# Patient Record
Sex: Male | Born: 1970 | Race: White | Hispanic: No | Smoking: Never smoker
Health system: Southern US, Community
[De-identification: ages and names within clinical notes are randomized; demographics above are authoritative.]

## PROBLEM LIST (undated history)

## (undated) DIAGNOSIS — F419 Anxiety disorder, unspecified: Secondary | ICD-10-CM

## (undated) DIAGNOSIS — I10 Essential (primary) hypertension: Secondary | ICD-10-CM

## (undated) DIAGNOSIS — J45909 Unspecified asthma, uncomplicated: Secondary | ICD-10-CM

## (undated) HISTORY — DX: Essential (primary) hypertension: I10

## (undated) HISTORY — DX: Anxiety disorder, unspecified: F41.9

---
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
## 1998-08-15 ENCOUNTER — Encounter: Admission: RE | Admit: 1998-08-15 | Discharge: 1998-11-07 | Payer: Self-pay

## 2008-06-10 ENCOUNTER — Emergency Department (HOSPITAL_COMMUNITY): Admission: EM | Admit: 2008-06-10 | Discharge: 2008-06-10 | Payer: Self-pay | Admitting: Family Medicine

## 2008-08-05 ENCOUNTER — Emergency Department (HOSPITAL_COMMUNITY): Admission: EM | Admit: 2008-08-05 | Discharge: 2008-08-05 | Payer: Self-pay | Admitting: Emergency Medicine

## 2011-07-10 LAB — POCT I-STAT, CHEM 8
Calcium, Ion: 1.22
Creatinine, Ser: 1.1
Potassium: 4.2
Sodium: 138

## 2011-07-10 LAB — POCT URINALYSIS DIP (DEVICE)
Hgb urine dipstick: NEGATIVE
Nitrite: NEGATIVE
Operator id: 247071
Protein, ur: NEGATIVE
Urobilinogen, UA: 0.2

## 2013-04-14 ENCOUNTER — Emergency Department (INDEPENDENT_AMBULATORY_CARE_PROVIDER_SITE_OTHER)
Admission: EM | Admit: 2013-04-14 | Discharge: 2013-04-14 | Disposition: A | Discharge status: Home / Self Care | Payer: Self-pay | Source: Home / Self Care | Attending: Family Medicine | Admitting: Family Medicine

## 2013-04-14 ENCOUNTER — Encounter (HOSPITAL_COMMUNITY): Payer: Self-pay | Admitting: Emergency Medicine

## 2013-04-14 DIAGNOSIS — S058X9A Other injuries of unspecified eye and orbit, initial encounter: Secondary | ICD-10-CM

## 2013-04-14 DIAGNOSIS — S0502XA Injury of conjunctiva and corneal abrasion without foreign body, left eye, initial encounter: Secondary | ICD-10-CM

## 2013-04-14 DIAGNOSIS — Z23 Encounter for immunization: Secondary | ICD-10-CM

## 2013-04-14 MED ORDER — MOXIFLOXACIN HCL 0.5 % OP SOLN: 1.0000 [drp] | Freq: Four times a day (QID) | OPHTHALMIC | Status: DC

## 2013-04-14 MED ORDER — TETANUS-DIPHTHERIA TOXOIDS TD 5-2 LFU IM INJ
0.5000 mL | INJECTION | Freq: Once | INTRAMUSCULAR | Status: AC
  Administered 2013-04-14: 0.5 mL via INTRAMUSCULAR

## 2013-04-14 MED ORDER — TETANUS-DIPHTH-ACELL PERTUSSIS 5-2.5-18.5 LF-MCG/0.5 IM SUSP
INTRAMUSCULAR | Status: AC
  Filled 2013-04-14: qty 0.5

## 2013-04-14 MED ORDER — HYDROCODONE-ACETAMINOPHEN 5-325 MG PO TABS: 1.0000 | ORAL_TABLET | ORAL | Status: DC | PRN

## 2013-04-14 NOTE — ED Notes (Signed)
Patient states he was hit in the left eye by a twig as he was cutting twigs off a bush.    Patient did use Vaseline but no relief.  Patient states he can not keep the eye open and that it burns.  Patient states because of the eye pain his head is hurting and the back of his eye hurts also.

## 2013-04-14 NOTE — ED Notes (Signed)
Shawn Macias at [facility] pharmacy: called to substitute medication for vigamox, pharmacy substituted moxeza.  Dr Artis Flock agreed to this substitution

## 2013-04-14 NOTE — ED Provider Notes (Signed)
   History    CSN: [id number] Arrival date & time 04/14/13  1540  First MD Initiated Contact with Patient 04/14/13 1654     Chief Complaint  Patient presents with  . Eye Pain   (Consider location/radiation/quality/duration/timing/severity/associated sxs/prior Treatment) Patient is a 42 y.o. male presenting with eye injury. The history is provided by the patient.  Eye Injury This is a new problem. The current episode started 1 to 2 hours ago (trimming bushes and struck in left eye with branch.). The problem has not changed since onset.  History reviewed. No pertinent past medical history. No past surgical history on file. No family history on file. History  Substance Use Topics  . Smoking status: Not on file  . Smokeless tobacco: Not on file  . Alcohol Use: Not on file    Review of Systems  Constitutional: Negative.   Eyes: Positive for photophobia, pain, redness and visual disturbance.    Allergies  Review of patient's allergies indicates no known allergies.  Home Medications   Current Outpatient Rx  Name  Route  Sig  Dispense  Refill  . HYDROcodone-acetaminophen (NORCO/VICODIN) 5-325 MG per tablet   Oral   Take 1 tablet by mouth every 4 (four) hours as needed for pain.   10 tablet   0   . moxifloxacin (VIGAMOX) 0.5 % ophthalmic solution   Left Eye   Place 1 drop into the left eye 4 (four) times daily.   3 mL   0    BP 154/91  Pulse 64  Temp(Src) 97.9 F (36.6 C) (Oral)  Resp 16  SpO2 100% Physical Exam  Nursing note and vitals reviewed. Constitutional: He appears well-developed and well-nourished. He appears distressed.  HENT:  Head: Normocephalic.  Eyes: Conjunctivae, EOM and lids are normal. Pupils are equal, round, and reactive to light. No foreign bodies found. Left conjunctiva is not injected.  Slit lamp exam:      The left eye shows corneal abrasion. The left eye shows no foreign body, no hyphema, no fluorescein uptake and no anterior chamber  bulge.      ED Course  Procedures (including critical care time) Labs Reviewed - No data to display No results found. 1. Corneal abrasion, left, initial encounter     MDM  Discussed with dr sanders and will f/u on thurs in office.  Linna Hoff, MD 04/14/13 [phone]

## 2014-05-31 ENCOUNTER — Encounter (HOSPITAL_COMMUNITY): Payer: Self-pay | Admitting: Emergency Medicine

## 2014-05-31 ENCOUNTER — Emergency Department (HOSPITAL_COMMUNITY)
Admission: EM | Admit: 2014-05-31 | Discharge: 2014-05-31 | Disposition: A | Discharge status: Home / Self Care | Payer: Self-pay | Attending: Emergency Medicine | Admitting: Emergency Medicine

## 2014-05-31 ENCOUNTER — Emergency Department (HOSPITAL_COMMUNITY): Payer: Self-pay

## 2014-05-31 DIAGNOSIS — Z79899 Other long term (current) drug therapy: Secondary | ICD-10-CM | POA: Insufficient documentation

## 2014-05-31 DIAGNOSIS — F17200 Nicotine dependence, unspecified, uncomplicated: Secondary | ICD-10-CM | POA: Insufficient documentation

## 2014-05-31 DIAGNOSIS — R0789 Other chest pain: Secondary | ICD-10-CM | POA: Insufficient documentation

## 2014-05-31 DIAGNOSIS — J45909 Unspecified asthma, uncomplicated: Secondary | ICD-10-CM | POA: Insufficient documentation

## 2014-05-31 DIAGNOSIS — R112 Nausea with vomiting, unspecified: Secondary | ICD-10-CM | POA: Insufficient documentation

## 2014-05-31 DIAGNOSIS — R079 Chest pain, unspecified: Secondary | ICD-10-CM | POA: Insufficient documentation

## 2014-05-31 HISTORY — DX: Unspecified asthma, uncomplicated: J45.909

## 2014-05-31 LAB — LIPASE, BLOOD: Lipase: 20 U/L (ref 11–59)

## 2014-05-31 LAB — BASIC METABOLIC PANEL
Anion gap: 17 — ABNORMAL HIGH (ref 5–15)
BUN: 14 mg/dL (ref 6–23)
CALCIUM: 9.4 mg/dL (ref 8.4–10.5)
CHLORIDE: 102 meq/L (ref 96–112)
CO2: 20 meq/L (ref 19–32)
CREATININE: 0.97 mg/dL (ref 0.50–1.35)
GFR calc Af Amer: >90 mL/min (ref >90)
Glucose, Bld: 102 mg/dL — ABNORMAL HIGH (ref 70–99)
POTASSIUM: 4.4 meq/L (ref 3.7–5.3)
SODIUM: 139 meq/L (ref 137–147)

## 2014-05-31 LAB — CBC
HCT: 42.8 % (ref 39.0–52.0)
HEMOGLOBIN: 15.1 g/dL (ref 13.0–17.0)
MCH: 31.6 pg (ref 26.0–34.0)
MCHC: 35.3 g/dL (ref 30.0–36.0)
MCV: 89.5 fL (ref 78.0–100.0)
Platelets: 326 10*3/uL (ref 150–400)
RBC: 4.78 MIL/uL (ref 4.22–5.81)
RDW: 13.1 % (ref 11.5–15.5)
WBC: 9.7 10*3/uL (ref 4.0–10.5)

## 2014-05-31 LAB — HEPATIC FUNCTION PANEL
ALBUMIN: 4.4 g/dL (ref 3.5–5.2)
ALK PHOS: 71 U/L (ref 39–117)
ALT: 16 U/L (ref 0–53)
AST: 10 U/L (ref 0–37)
BILIRUBIN TOTAL: 1.2 mg/dL (ref 0.3–1.2)
TOTAL PROTEIN: 7.9 g/dL (ref 6.0–8.3)

## 2014-05-31 LAB — I-STAT TROPONIN, ED
TROPONIN I, POC: 0 ng/mL (ref 0.00–0.08)
Troponin i, poc: 0 ng/mL (ref 0.00–0.08)

## 2014-05-31 MED ORDER — KETOROLAC TROMETHAMINE 30 MG/ML IJ SOLN
30.0000 mg | Freq: Once | INTRAMUSCULAR | Status: AC
  Administered 2014-05-31: 30 mg via INTRAVENOUS
  Filled 2014-05-31: qty 1

## 2014-05-31 MED ORDER — SODIUM CHLORIDE 0.9 % IV BOLUS (SEPSIS)
1000.0000 mL | Freq: Once | INTRAVENOUS | Status: AC
  Administered 2014-05-31: 1000 mL via INTRAVENOUS

## 2014-05-31 MED ORDER — ONDANSETRON 4 MG PO TBDP
8.0000 mg | ORAL_TABLET | Freq: Once | ORAL | Status: AC
  Administered 2014-05-31: 8 mg via ORAL
  Filled 2014-05-31: qty 2

## 2014-05-31 MED ORDER — NAPROXEN 500 MG PO TABS: 500.0000 mg | ORAL_TABLET | Freq: Two times a day (BID) | ORAL | Status: DC

## 2014-05-31 MED ORDER — DIAZEPAM 5 MG PO TABS: 5.0000 mg | ORAL_TABLET | Freq: Two times a day (BID) | ORAL | Status: DC

## 2014-05-31 MED ORDER — ASPIRIN 81 MG PO CHEW
324.0000 mg | CHEWABLE_TABLET | Freq: Once | ORAL | Status: AC
  Administered 2014-05-31: 324 mg via ORAL
  Filled 2014-05-31: qty 4

## 2014-05-31 MED ORDER — DIAZEPAM 5 MG/ML IJ SOLN
5.0000 mg | Freq: Once | INTRAMUSCULAR | Status: AC
  Administered 2014-05-31: 5 mg via INTRAVENOUS
  Filled 2014-05-31: qty 2

## 2014-05-31 NOTE — Discharge Instructions (Signed)
Please follow up with your primary care physician in 1-2 days. If you do not have one please call the [facility] number listed above. Please follow up with Dr. Daleen SquibbWall to schedule a follow up appointment.  Please read all discharge instructions and return precautions.    Chest Pain (Nonspecific) It is often hard to give a specific diagnosis for the cause of chest pain. There is always a chance that your pain could be related to something serious, such as a heart attack or a blood clot in the lungs. You need to follow up with your health care provider for further evaluation. CAUSES   Heartburn.  Pneumonia or bronchitis.  Anxiety or stress.  Inflammation around your heart (pericarditis) or lung (pleuritis or pleurisy).  A blood clot in the lung.  A collapsed lung (pneumothorax). It can develop suddenly on its own (spontaneous pneumothorax) or from trauma to the chest.  Shingles infection (herpes zoster virus). The chest wall is composed of bones, muscles, and cartilage. Any of these can be the source of the pain.  The bones can be bruised by injury.  The muscles or cartilage can be strained by coughing or overwork.  The cartilage can be affected by inflammation and become sore (costochondritis). DIAGNOSIS  Lab tests or other studies may be needed to find the cause of your pain. Your health care provider may have you take a test called an ambulatory electrocardiogram (ECG). An ECG records your heartbeat patterns over a 24-hour period. You may also have other tests, such as:  Transthoracic echocardiogram (TTE). During echocardiography, sound waves are used to evaluate how blood flows through your heart.  Transesophageal echocardiogram (TEE).  Cardiac monitoring. This allows your health care provider to monitor your heart rate and rhythm in real time.  Holter monitor. This is a portable device that records your heartbeat and can help diagnose heart arrhythmias. It  allows your health care provider to track your heart activity for several days, if needed.  Stress tests by exercise or by giving medicine that makes the heart beat faster. TREATMENT   Treatment depends on what may be causing your chest pain. Treatment may include:  Acid blockers for heartburn.  Anti-inflammatory medicine.  Pain medicine for inflammatory conditions.  Antibiotics if an infection is present.  You may be advised to change lifestyle habits. This includes stopping smoking and avoiding alcohol, caffeine, and chocolate.  You may be advised to keep your head raised (elevated) when sleeping. This reduces the chance of acid going backward from your stomach into your esophagus. Most of the time, nonspecific chest pain will improve within 2-3 days with rest and mild pain medicine.  HOME CARE INSTRUCTIONS   If antibiotics were prescribed, take them as directed. Finish them even if you start to feel better.  For the next few days, avoid physical activities that bring on chest pain. Continue physical activities as directed.  Do not use any tobacco products, including cigarettes, chewing tobacco, or electronic cigarettes.  Avoid drinking alcohol.  Only take medicine as directed by your health care provider.  Follow your health care provider's suggestions for further testing if your chest pain does not go away.  Keep any follow-up appointments you made. If you do not go to an appointment, you could develop lasting (chronic) problems with pain. If there is any problem keeping an appointment, call to reschedule. SEEK MEDICAL CARE IF:   Your chest pain does not go away, even after treatment.  You  have a rash with blisters on your chest.  You have a fever. SEEK IMMEDIATE MEDICAL CARE IF:   You have increased chest pain or pain that spreads to your arm, neck, jaw, back, or abdomen.  You have shortness of breath.  You have an increasing cough, or you cough up blood.  You  have severe back or abdominal pain.  You feel nauseous or vomit.  You have severe weakness.  You faint.  You have chills. This is an emergency. Do not wait to see if the pain will go away. Get medical help at once. Call your local emergency services (911 in [place] Do not drive yourself to the hospital. MAKE SURE YOU:   Understand these instructions.  Will watch your condition.  Will get help right away if you are not doing well or get worse. Document Released: 07/03/2005 Document Revised: 09/28/2013 Document Reviewed: 04/28/2008 [facility] Patient Information 2015 [place], [place]. This information is not intended to replace advice given to you by your health care provider. Make sure you discuss any questions you have with your health care provider. Generalized Anxiety Disorder Generalized anxiety disorder (GAD) is a mental disorder. It interferes with life functions, including relationships, work, and school. GAD is different from normal anxiety, which everyone experiences at some point in their lives in response to specific life events and activities. Normal anxiety actually helps [place] prepare for and get through these life events and activities. Normal anxiety goes away after the event or activity is over.  GAD causes anxiety that is not necessarily related to specific events or activities. It also causes excess anxiety in proportion to specific events or activities. The anxiety associated with GAD is also difficult to control. GAD can vary from mild to severe. People with severe GAD can have intense waves of anxiety with physical symptoms (panic attacks).  SYMPTOMS The anxiety and worry associated with GAD are difficult to control. This anxiety and worry are related to many life events and activities and also occur more days than not for 6 months or longer. People with GAD also have three or more of the following symptoms (one or more in children):  Restlessness.   Fatigue.  Difficulty  concentrating.   Irritability.  Muscle tension.  Difficulty sleeping or unsatisfying sleep. DIAGNOSIS GAD is diagnosed through an assessment by your health care provider. Your health care provider will ask you questions aboutyour mood,physical symptoms, and events in your life. Your health care provider may ask you about your medical history and use of alcohol or drugs, including prescription medicines. Your health care provider may also do a physical exam and blood tests. Certain medical conditions and the use of certain substances can cause symptoms similar to those associated with GAD. Your health care provider may refer you to a mental health specialist for further evaluation. TREATMENT The following therapies are usually used to treat GAD:   Medication. Antidepressant medication usually is prescribed for long-term daily control. Antianxiety medicines may be added in severe cases, especially when panic attacks occur.   Talk therapy (psychotherapy). Certain types of talk therapy can be helpful in treating GAD by providing support, education, and guidance. A form of talk therapy called cognitive behavioral therapy can teach you healthy ways to think about and react to daily life events and activities.  Stress managementtechniques. These include yoga, meditation, and exercise and can be very helpful when they are practiced regularly. A mental health specialist can help determine which treatment is best for you. Some  people see improvement with one therapy. However, other people require a combination of therapies. Document Released: 01/18/2013 Document Revised: 02/07/2014 Document Reviewed: 01/18/2013 [facility] Patient Information 2015 [place], [place]. This information is not intended to replace advice given to you by your health care provider. Make sure you discuss any questions you have with your health care provider.  Establish relationship with primary care doctor as discussed. A  resource guide and information on the Affordable Care Act has been provided for your information.    RESOURCE GUIDE  If you do not have a primary care doctor to follow up with regarding today's visit, please call the [facility] at [phone] to make an appointment. Hours of operation are 10am - 7pm, Monday through Friday, and they have a sliding scale fee.   Insufficient Money for Medicine: Contact United Way:  call "211" or [facility] [phone].  No Primary Care Doctor: - Call [facility]  [phone] - can help you locate a primary care doctor that  accepts your insurance, provides certain services, etc. - Physician Referral Service- [phone]  Agencies that provide inexpensive medical care: - [facility]  [phone] - [facility]  [phone] - [facility]  [phone] - [facility]  [phone] - [facility]  [phone] [facility]  [phone]  Medicaid-accepting [facility] Providers: - [facility]- [address]  [phone], Mon-Fri 9am-7pm, Sat 9am-1pm - [facility]- [address] [place], [address] [place]  [phone] - [facility]- [address] [place]  [phone] [facility]- [address]  [phone] - Renaye Rakers- [address] [place], [address], [phone]  Only accepts [place] patients after they have their name  applied to their card  Self Pay (no insurance) in [place]: - Sickle Cell Patients: Dr Willey Blade, [facility]  [address] [place], [phone] - [facility]- [address] [place]  [phone]       [facility]- [address]       -     [facility]- see information above (Speak to [employer] if you do not have insurance)       -  [facility]- [address] [place], [phone]        -  [facility]- [address],  [phone]       -  [facility]- [address] [phone]       -  Dr Julio Sicks-  [address], [place], [phone]       -  [facility]- [address], [phone]       -  [facility]- [address], [phone], also [address] [phone]       -    [facility]- [address] [place], [phone], 1st & 3rd Saturday   every month, 10am-1pm  1) Find a Doctor and Pay Out of Pocket Although you won't have to find out who is covered by your insurance plan, it is a good idea to ask around and get recommendations. You will then need to call the office and see if the doctor you have chosen will accept you as a new patient and what types  of options they offer for patients who are self-pay. Some doctors offer discounts or will set up payment plans for their patients who do not have insurance, but you will need to ask so you aren't surprised when you get to your appointment.  2) Contact Your Local Health Department Not all health departments have doctors that can see patients for sick visits, but many do, so it is worth a call to see if yours does. If you don't know where your local health department is, you can check in your phone book. The CDC also has a tool to help you locate your state's health department, and many state websites also have listings of all of their local health departments.  3) Find a Walk-in Clinic If your illness is not likely to be very severe or complicated, you may want to try a walk in clinic. These are popping up all over the country in pharmacies, drugstores, and shopping centers. They're usually staffed by nurse practitioners or physician assistants that have been trained to treat common illnesses and complaints. They're usually fairly quick and inexpensive. However, if you have serious medical issues or chronic medical problems, these are probably not your best  option

## 2014-05-31 NOTE — ED Provider Notes (Signed)
CSN: [id number]     Arrival date & time 05/31/14  4098 History   First MD Initiated Contact with Patient 05/31/14 0919     Chief Complaint  Patient presents with  . Chest Pain     (Consider location/radiation/quality/duration/timing/severity/associated sxs/prior Treatment) HPI Comments: Patient is a 43 year old male past medical history significant for tobacco abuse, asthma presented to the emergency department for intermittent episodes of central chest pain with radiation to left arm and neck. Patient is unaware of how long each episode of pain lasts. Last episode of pain began a few minutes ago. Alleviating factors: rest. Aggravating factors: none. Medications tried prior to arrival: none. Endorses associated nausea and vomiting on Saturday, this has resolved. Patient does have a brother had an MI at 62 yo. PERC negative.     Patient is a 43 y.o. male presenting with chest pain.  Chest Pain Associated symptoms: nausea and vomiting   Associated symptoms: no cough, no fever and no shortness of breath     Past Medical History  Diagnosis Date  . Asthma    History reviewed. No pertinent past surgical history. No family history on file. History  Substance Use Topics  . Smoking status: Current Every Day Smoker    Types: Cigarettes  . Smokeless tobacco: Not on file  . Alcohol Use: Yes    Review of Systems  Constitutional: Negative for fever and chills.  Respiratory: Negative for cough and shortness of breath.   Cardiovascular: Positive for chest pain. Negative for leg swelling.  Gastrointestinal: Positive for nausea and vomiting.  All other systems reviewed and are negative.     Allergies  Review of patient's allergies indicates no known allergies.  Home Medications   Prior to Admission medications   Medication Sig Start Date End Date Taking? Authorizing Provider  diazepam (VALIUM) 5 MG tablet Take 1 tablet (5 mg total) by mouth 2 (two) times daily. 05/31/14   Dillan Lunden L  Dhruva Orndoff, PA-C  naproxen (NAPROSYN) 500 MG tablet Take 1 tablet (500 mg total) by mouth 2 (two) times daily with a meal. 05/31/14   Christi Wirick L Zyair Russi, PA-C   BP 147/81  Pulse 84  Temp(Src) 98 F (36.7 C) (Oral)  Resp 25  Ht  (1.803 m)  Wt 165 lb (74.844 kg)  BMI 23.02 kg/m2  SpO2 95% Physical Exam  Nursing note and vitals reviewed. Constitutional: He is oriented to person, place, and time. He appears well-developed and well-nourished. No distress.  HENT:  Head: Normocephalic and atraumatic.  Right Ear: External ear normal.  Left Ear: External ear normal.  Nose: Nose normal.  Mouth/Throat: Oropharynx is clear and moist. No oropharyngeal exudate.  Eyes: Conjunctivae are normal.  Neck: Neck supple.  Cardiovascular: Normal rate, regular rhythm, normal heart sounds and intact distal pulses.   Pulmonary/Chest: Effort normal and breath sounds normal. No respiratory distress.  Abdominal: Soft. Bowel sounds are normal. There is no tenderness.  Musculoskeletal: He exhibits no edema.  Neurological: He is alert and oriented to person, place, and time.  Skin: Skin is warm and dry. He is not diaphoretic.    ED Course  Procedures (including critical care time) Medications  ondansetron (ZOFRAN-ODT) disintegrating tablet 8 mg (8 mg Oral Given 05/31/14 1006)  aspirin chewable tablet 324 mg (324 mg Oral Given 05/31/14 1006)  ketorolac (TORADOL) 30 MG/ML injection 30 mg (30 mg Intravenous Given 05/31/14 1144)  diazepam (VALIUM) injection 5 mg (5 mg Intravenous Given 05/31/14 1238)  sodium chloride 0.9 % bolus  1,000 mL (0 mLs Intravenous Stopped 05/31/14 1343)    Labs Review Labs Reviewed  BASIC METABOLIC PANEL - Abnormal; Notable for the following:    Glucose, Bld 102 (*)    Anion gap 17 (*)    All other components within normal limits  CBC  HEPATIC FUNCTION PANEL  LIPASE, BLOOD  I-STAT TROPOININ, ED  I-STAT TROPOININ, ED    Imaging Review Dg Chest 2 View  05/31/2014    CLINICAL DATA:  Left arm pain  EXAM: CHEST  2 VIEW  COMPARISON:  08/05/2008  FINDINGS: The heart size and mediastinal contours are within normal limits. Both lungs are clear. The visualized skeletal structures are unremarkable.  IMPRESSION: No active cardiopulmonary disease.   Electronically Signed   By: Elige Ko   On: 05/31/2014 10:40     EKG Interpretation   Date/Time:  Tuesday May 31 2014 09:06:25 EDT Ventricular Rate:  97 PR Interval:  128 QRS Duration: 92 QT Interval:  338 QTC Calculation: 429 R Axis:   74 Text Interpretation:  Normal sinus rhythm Normal ECG Confirmed by DELOS   MD, DOUGLAS ([id number]) on 05/31/2014 10:11:22 AM      MDM   Final diagnoses:  Chest pain, atypical    Filed Vitals:   05/31/14 1344  BP:   Pulse:   Temp: 98 F (36.7 C)  Resp:    Afebrile, NAD, non-toxic appearing, AAOx4.  Patient is to be discharged with recommendation to follow up with PCP in regards to today's hospital visit. Chest pain is not likely of cardiac or pulmonary etiology d/t presentation, perc negative, VSS, no tracheal deviation, no JVD or new murmur, RRR, breath sounds equal bilaterally, EKG without acute abnormalities, negative delta troponin, and negative CXR. Pt has been advised to return to the ED is CP becomes exertional, associated with diaphoresis or nausea, radiates to left jaw/arm, worsens or becomes concerning in any way. Pt appears reliable for follow up and is agreeable to discharge.   Case has been discussed with Dr. Judd Lien who agrees with the above plan to discharge.      Lise Auer Sora Vrooman, PA-C 05/31/14 1440

## 2014-05-31 NOTE — ED Notes (Signed)
Patient states has been having intermittent chest pain since Saturday of last week.  Patient states had SOB, L arm pain, and neck pain associated with same.   Patient denies pain at this time, but states comes and goes.

## 2014-05-31 NOTE — ED Provider Notes (Signed)
Medical screening examination/treatment/procedure(s) were performed by non-physician practitioner and as supervising physician I was immediately available for consultation/collaboration.   EKG Interpretation   Date/Time:  Tuesday May 31 2014 09:06:25 EDT Ventricular Rate:  97 PR Interval:  128 QRS Duration: 92 QT Interval:  338 QTC Calculation: 429 R Axis:   74 Text Interpretation:  Normal sinus rhythm Normal ECG Confirmed by Malva Cogan   MD, Cashius Grandstaff ([id number]) on 05/31/2014 10:11:22 AM       Geoffery Lyons, MD 05/31/14 1630

## 2014-05-31 NOTE — ED Notes (Signed)
Kim in lab aware of add on labs

## 2014-06-09 ENCOUNTER — Encounter: Payer: Self-pay | Admitting: Internal Medicine

## 2014-06-09 ENCOUNTER — Ambulatory Visit: Payer: Self-pay | Attending: Internal Medicine | Admitting: Internal Medicine

## 2014-06-09 VITALS — BP 156/101 | HR 72 | Temp 98.7°F | Resp 16 | Ht 70.0 in | Wt 162.0 lb

## 2014-06-09 DIAGNOSIS — Z833 Family history of diabetes mellitus: Secondary | ICD-10-CM | POA: Insufficient documentation

## 2014-06-09 DIAGNOSIS — I10 Essential (primary) hypertension: Secondary | ICD-10-CM | POA: Insufficient documentation

## 2014-06-09 DIAGNOSIS — R079 Chest pain, unspecified: Secondary | ICD-10-CM | POA: Insufficient documentation

## 2014-06-09 DIAGNOSIS — Z8249 Family history of ischemic heart disease and other diseases of the circulatory system: Secondary | ICD-10-CM | POA: Insufficient documentation

## 2014-06-09 DIAGNOSIS — Z7189 Other specified counseling: Secondary | ICD-10-CM | POA: Insufficient documentation

## 2014-06-09 DIAGNOSIS — Z823 Family history of stroke: Secondary | ICD-10-CM | POA: Insufficient documentation

## 2014-06-09 LAB — LIPID PANEL
Cholesterol: 144 mg/dL (ref 0–200)
HDL: 55 mg/dL (ref >39)
LDL Cholesterol: 73 mg/dL (ref 0–99)
TRIGLYCERIDES: 80 mg/dL (ref <150)
Total CHOL/HDL Ratio: 2.6 Ratio
VLDL: 16 mg/dL (ref 0–40)

## 2014-06-09 LAB — POCT GLYCOSYLATED HEMOGLOBIN (HGB A1C): Hemoglobin A1C: 5.7

## 2014-06-09 LAB — GLUCOSE, POCT (MANUAL RESULT ENTRY): POC Glucose: 111 mg/dl — AB (ref 70–99)

## 2014-06-09 LAB — TSH: TSH: 1.676 u[IU]/mL (ref 0.350–4.500)

## 2014-06-09 MED ORDER — LISINOPRIL 10 MG PO TABS: 10.0000 mg | ORAL_TABLET | Freq: Every day | ORAL | Status: DC

## 2014-06-09 MED ORDER — BACITRACIN 500 UNIT/GM EX OINT: 1.0000 "application " | TOPICAL_OINTMENT | Freq: Two times a day (BID) | CUTANEOUS | Status: DC

## 2014-06-09 NOTE — Patient Instructions (Signed)
DASH Eating Plan °DASH stands for "Dietary Approaches to Stop Hypertension." The DASH eating plan is a healthy eating plan that has been shown to reduce high blood pressure (hypertension). Additional health benefits may include reducing the risk of type 2 diabetes mellitus, heart disease, and stroke. The DASH eating plan may also help with weight loss. °WHAT DO I NEED TO KNOW ABOUT THE DASH EATING PLAN? °For the DASH eating plan, you will follow these general guidelines: °· Choose foods with a percent daily value for sodium of less than 5% (as listed on the food label). °· Use salt-free seasonings or herbs instead of table salt or sea salt. °· Check with your health care provider or pharmacist before using salt substitutes. °· Eat lower-sodium products, often labeled as "lower sodium" or "no salt added." °· Eat fresh foods. °· Eat more vegetables, fruits, and low-fat dairy products. °· Choose whole grains. Look for the word "whole" as the first word in the ingredient list. °· Choose fish and skinless chicken or turkey more often than red meat. Limit fish, poultry, and meat to 6 oz (170 g) each day. °· Limit sweets, desserts, sugars, and sugary drinks. °· Choose heart-healthy fats. °· Limit cheese to 1 oz (28 g) per day. °· Eat more home-cooked food and less restaurant, buffet, and fast food. °· Limit fried foods. °· Cook foods using methods other than frying. °· Limit canned vegetables. If you do use them, rinse them well to decrease the sodium. °· When eating at a restaurant, ask that your food be prepared with less salt, or no salt if possible. °WHAT FOODS CAN I EAT? °Seek help from a dietitian for individual calorie needs. °Grains °Whole grain or whole wheat bread. Brown rice. Whole grain or whole wheat pasta. Quinoa, bulgur, and whole grain cereals. Low-sodium cereals. Corn or whole wheat flour tortillas. Whole grain cornbread. Whole grain crackers. Low-sodium crackers. °Vegetables °Fresh or frozen vegetables  (raw, steamed, roasted, or grilled). Low-sodium or reduced-sodium tomato and vegetable juices. Low-sodium or reduced-sodium tomato sauce and paste. Low-sodium or reduced-sodium canned vegetables.  °Fruits °All fresh, canned (in natural juice), or frozen fruits. °Meat and Other Protein Products °Ground beef (85% or leaner), grass-fed beef, or beef trimmed of fat. Skinless chicken or turkey. Ground chicken or turkey. Pork trimmed of fat. All fish and seafood. Eggs. Dried beans, peas, or lentils. Unsalted nuts and seeds. Unsalted canned beans. °Dairy °Low-fat dairy products, such as skim or 1% milk, 2% or reduced-fat cheeses, low-fat ricotta or cottage cheese, or plain low-fat yogurt. Low-sodium or reduced-sodium cheeses. °Fats and Oils °Tub margarines without trans fats. Light or reduced-fat mayonnaise and salad dressings (reduced sodium). Avocado. Safflower, olive, or canola oils. Natural peanut or almond butter. °Other °Unsalted popcorn and pretzels. °The items listed above may not be a complete list of recommended foods or beverages. Contact your dietitian for more options. °WHAT FOODS ARE NOT RECOMMENDED? °Grains °White bread. White pasta. White rice. Refined cornbread. Bagels and croissants. Crackers that contain trans fat. °Vegetables °Creamed or fried vegetables. Vegetables in a cheese sauce. Regular canned vegetables. Regular canned tomato sauce and paste. Regular tomato and vegetable juices. °Fruits °Dried fruits. Canned fruit in light or heavy syrup. Fruit juice. °Meat and Other Protein Products °Fatty cuts of meat. Ribs, chicken wings, bacon, sausage, bologna, salami, chitterlings, fatback, hot dogs, bratwurst, and packaged luncheon meats. Salted nuts and seeds. Canned beans with salt. °Dairy °Whole or 2% milk, cream, half-and-half, and cream cheese. Whole-fat or sweetened yogurt. Full-fat   cheeses or blue cheese. Nondairy creamers and whipped toppings. Processed cheese, cheese spreads, or cheese  curds. °Condiments °Onion and garlic salt, seasoned salt, table salt, and sea salt. Canned and packaged gravies. Worcestershire sauce. Tartar sauce. Barbecue sauce. Teriyaki sauce. Soy sauce, including reduced sodium. Steak sauce. Fish sauce. Oyster sauce. Cocktail sauce. Horseradish. Ketchup and mustard. Meat flavorings and tenderizers. Bouillon cubes. Hot sauce. Tabasco sauce. Marinades. Taco seasonings. Relishes. °Fats and Oils °Butter, stick margarine, lard, shortening, ghee, and bacon fat. Coconut, palm kernel, or palm oils. Regular salad dressings. °Other °Pickles and olives. Salted popcorn and pretzels. °The items listed above may not be a complete list of foods and beverages to avoid. Contact your dietitian for more information. °WHERE CAN I FIND MORE INFORMATION? °National Heart, [employer], and [employer] Institute: www.nhlbi.nih.gov/health/health-topics/topics/dash/ °Document Released: 09/12/2011 Document Revised: 02/07/2014 Document Reviewed: 07/28/2013 °ExitCare® Patient Information ©2015 [employer]. This information is not intended to replace advice given to you by your health care provider. Make sure you discuss any questions you have with your health care provider. ° °

## 2014-06-09 NOTE — Progress Notes (Signed)
Patient ID: Shawn Macias, male   DOB: 27-Oct-1970, 43 y.o.   MRN: [id number]  HYQ:[id number]  [id number]:[id number]  DOB - 20-Aug-1971   HPI: Shawn Macias is a 43 y.o. male here today to establish medical care.  Patient reports that he was seen in the ED last week for chest pain. The pain was rule out as cardiac in nature.  He has had hypertension in past but has never been treated for it.  He has noticed slight chest pain since discharge but it only lasted a few minutes.  He has been taking Naproxen once per day since discharge.  He reports abdominal pain since hospital discharge.  He reports that it starts on his left side and causes generalized abdominal pain.  He endorses a change in stools that have been green in color and pasty.  Abdominal pain is a burning and achy pain.     No Known Allergies Past Medical History  Diagnosis Date  . Asthma    Current Outpatient Prescriptions on File Prior to Visit  Medication Sig Dispense Refill  . naproxen (NAPROSYN) 500 MG tablet Take 1 tablet (500 mg total) by mouth 2 (two) times daily with a meal.  30 tablet  0  . diazepam (VALIUM) 5 MG tablet Take 1 tablet (5 mg total) by mouth 2 (two) times daily.  10 tablet  0   No current facility-administered medications on file prior to visit.   Family History  Problem Relation Age of Onset  . Diabetes Mother   . Heart disease Mother   . Hypertension Mother   . Heart disease Brother   . Hypertension Brother   . Diabetes Maternal Grandmother   . Heart disease Maternal Grandmother   . Hypertension Maternal Grandmother   . Stroke Paternal Grandmother   . Stroke Paternal Grandfather    History   Social History  . Marital Status: Single    Spouse Name: N/A    Number of Children: N/A  . Years of Education: N/A   Occupational History  . Not on file.   Social History Main Topics  . Smoking status: Never Smoker   . Smokeless tobacco: Not on file  . Alcohol Use: 1.0 oz/week    2 drink(s) per week  .  Drug Use: No  . Sexual Activity: Yes   Other Topics Concern  . Not on file   Social History Narrative  . No narrative on file    Review of Systems  Eyes: Negative.   Respiratory: Positive for shortness of breath.   Cardiovascular: Positive for chest pain.  Gastrointestinal: Positive for abdominal pain. Negative for nausea, vomiting, diarrhea, constipation and blood in stool.  Genitourinary: Negative.   Musculoskeletal: Negative.   Neurological: Negative for dizziness, tingling and headaches.     Objective:   Filed Vitals:   06/09/14 1201  BP: 156/101  Pulse: 72  Temp: 98.7 F (37.1 C)  Resp: 16    Physical Exam: Constitutional: Patient appears well-developed and well-nourished. No distress. HENT: Normocephalic, atraumatic, External right and left ear normal. Oropharynx is clear and moist.  Eyes: Conjunctivae and EOM are normal. PERRLA, no scleral icterus. Neck: Normal ROM. Neck supple. No JVD. No tracheal deviation. No thyromegaly. CVS: RRR, S1/S2 +, no murmurs, no gallops, no carotid bruit.  Pulmonary: Effort and breath sounds normal, no stridor, rhonchi, wheezes, rales.  Abdominal: Soft. BS +, no distension, tenderness, rebound or guarding.  Musculoskeletal: Normal range of motion. No edema and no tenderness.  Lymphadenopathy:  No lymphadenopathy noted, cervical Neuro: Alert. Normal reflexes, muscle tone coordination. No cranial nerve deficit. Skin: Skin is warm and dry. Not diaphoretic. No pallor. Right arm has erythematous small area, blister-possible bug bite Psychiatric: Normal mood and affect. Behavior, judgment, thought content normal.  Lab Results  Component Value Date   WBC 9.7 05/31/2014   HGB 15.1 05/31/2014   HCT 42.8 05/31/2014   MCV 89.5 05/31/2014   PLT 326 05/31/2014   Lab Results  Component Value Date   CREATININE 0.97 05/31/2014   BUN 14 05/31/2014   NA 139 05/31/2014   K 4.4 05/31/2014   CL 102 05/31/2014   CO2 20 05/31/2014    No results found  for this basename: HGBA1C   Lipid Panel  No results found for this basename: chol, trig, hdl, cholhdl, vldl, ldlcalc       Assessment and plan:   Nicklas was seen today for no specified reason.  Diagnoses and associated orders for this visit:  Essential hypertension - lisinopril (PRINIVIL,ZESTRIL) 10 MG tablet; Take 1 tablet (10 mg total) by mouth daily. - Lipid panel - TSH - PSA  Chest pain All test were negative in ED. Encouraged patient to begin asiprin 81 mg daily.  Family history of diabetes mellitus (DM) - POCT glucose (manual entry) - POCT glycosylated hemoglobin (Hb A1C) - Lipid panel - TSH  Other Orders - bacitracin 500 UNIT/GM ointment; Apply 1 application topically 2 (two) times daily.     Return for  Nurse Visit-BP and 3 mo pcp.     The patient was given clear instructions to go to ER or return to medical center if symptoms don't improve, worsen or new problems develop. Explained in detail specific signs and symptoms that will cause him to seek immediate care.  Holland Commons, NP-C [facility] [phone] 06/09/2014, 12:31 PM

## 2014-06-09 NOTE — Progress Notes (Signed)
Pt. Here for ED follow-up due to chest pain on left side along with neck, back. Since ED visit stomach pain has occurred along with discolored bowel movements (green).

## 2014-06-10 LAB — PSA: PSA: 2.12 ng/mL (ref <=4.00)

## 2014-06-14 ENCOUNTER — Telehealth: Payer: Self-pay | Admitting: Emergency Medicine

## 2014-06-14 NOTE — Telephone Encounter (Signed)
Message copied by Darlis Loan on Tue Jun 14, 2014  2:11 PM ------      Message from: Holland Commons A      Created: Fri Jun 10, 2014  3:52 PM       Labs are normal ------

## 2014-06-14 NOTE — Telephone Encounter (Signed)
Left message for pt to call when message received 

## 2014-06-15 ENCOUNTER — Telehealth: Payer: Self-pay | Admitting: Emergency Medicine

## 2014-06-15 ENCOUNTER — Telehealth: Payer: Self-pay | Admitting: Internal Medicine

## 2014-06-15 NOTE — Telephone Encounter (Signed)
Pt informed blood work WNL

## 2014-06-15 NOTE — Telephone Encounter (Signed)
Pt. Returning nurse's call about results. Please f/u with pt.

## 2014-06-23 ENCOUNTER — Ambulatory Visit: Payer: Self-pay | Attending: Internal Medicine | Admitting: *Deleted

## 2014-06-23 VITALS — BP 105/62 | HR 73 | Temp 98.1°F | Resp 18

## 2014-06-23 DIAGNOSIS — Z79899 Other long term (current) drug therapy: Secondary | ICD-10-CM | POA: Insufficient documentation

## 2014-06-23 DIAGNOSIS — I10 Essential (primary) hypertension: Secondary | ICD-10-CM | POA: Insufficient documentation

## 2014-06-23 DIAGNOSIS — Z7982 Long term (current) use of aspirin: Secondary | ICD-10-CM | POA: Insufficient documentation

## 2014-06-23 DIAGNOSIS — Z0130 Encounter for examination of blood pressure without abnormal findings: Secondary | ICD-10-CM

## 2014-06-23 NOTE — Progress Notes (Signed)
Patient presents for BP recheck States he's been taking lisinopril and baby aspirin every AM. States he has been having difficulty falling asleep and staying asleep for 2-3 weeks. States he's been under a lot of stress at home. Patient is bouncing right leg at rapid rate. States this has been a life long habit. Discussed relaxation techniques and demonstrated breathing exercises to help pt relax. Patient return demonstrated correctly.  BP 105/62 P 73 SPO2 98%  Patient is aware of need for 3 month f/u with PCP.

## 2014-08-24 ENCOUNTER — Encounter: Payer: Self-pay | Admitting: Internal Medicine

## 2014-08-24 ENCOUNTER — Ambulatory Visit: Payer: Self-pay | Attending: Internal Medicine | Admitting: Internal Medicine

## 2014-08-24 VITALS — BP 125/81 | HR 94 | Temp 98.2°F | Resp 16 | Ht 70.0 in | Wt 162.0 lb

## 2014-08-24 DIAGNOSIS — R7309 Other abnormal glucose: Secondary | ICD-10-CM | POA: Insufficient documentation

## 2014-08-24 DIAGNOSIS — Z791 Long term (current) use of non-steroidal anti-inflammatories (NSAID): Secondary | ICD-10-CM | POA: Insufficient documentation

## 2014-08-24 DIAGNOSIS — I10 Essential (primary) hypertension: Secondary | ICD-10-CM | POA: Insufficient documentation

## 2014-08-24 DIAGNOSIS — F419 Anxiety disorder, unspecified: Secondary | ICD-10-CM | POA: Insufficient documentation

## 2014-08-24 DIAGNOSIS — Z2821 Immunization not carried out because of patient refusal: Secondary | ICD-10-CM

## 2014-08-24 LAB — GLUCOSE, POCT (MANUAL RESULT ENTRY): POC GLUCOSE: 105 mg/dL — AB (ref 70–99)

## 2014-08-24 LAB — POCT GLYCOSYLATED HEMOGLOBIN (HGB A1C): Hemoglobin A1C: 5.4

## 2014-08-24 MED ORDER — ALPRAZOLAM 0.25 MG PO TABS: 0.2500 mg | ORAL_TABLET | Freq: Two times a day (BID) | ORAL | Status: DC | PRN

## 2014-08-24 NOTE — Progress Notes (Signed)
Patient ID: Shawn Macias, male   DOB: Feb 15, 1971, 43 y.o.   MRN: [id number]  CC:  HTN, anxiety  HPI:  Patient presents to clinic today for a follow up of hypertension.  He states that he stopped taking his blood pressure medication because he did not like the side effects of metal taste in his mouth.  He reports that he also had symptoms of dizziness and weakness while on lisinopril.  He states that he has not been on the medication in over one week but he is controlled today.  Has been taking xanax to sleep and to help with anxiety. Today he c/o of anxiety due to the break up of him and his girlfriend of 17 years.  He reports that for the past three months he has been having difficult staying asleep with early awakenings around 2 am each morning.   No Known Allergies Past Medical History  Diagnosis Date  . Asthma    Current Outpatient Prescriptions on File Prior to Visit  Medication Sig Dispense Refill  . bacitracin 500 UNIT/GM ointment Apply 1 application topically 2 (two) times daily. 15 g 1  . diazepam (VALIUM) 5 MG tablet Take 1 tablet (5 mg total) by mouth 2 (two) times daily. 10 tablet 0  . lisinopril (PRINIVIL,ZESTRIL) 10 MG tablet Take 1 tablet (10 mg total) by mouth daily. 90 tablet 3  . naproxen (NAPROSYN) 500 MG tablet Take 1 tablet (500 mg total) by mouth 2 (two) times daily with a meal. 30 tablet 0   No current facility-administered medications on file prior to visit.   Family History  Problem Relation Age of Onset  . Diabetes Mother   . Heart disease Mother   . Hypertension Mother   . Heart disease Brother   . Hypertension Brother   . Diabetes Maternal Grandmother   . Heart disease Maternal Grandmother   . Hypertension Maternal Grandmother   . Stroke Paternal Grandmother   . Stroke Paternal Grandfather    History   Social History  . Marital Status: Single    Spouse Name: N/A    Number of Children: N/A  . Years of Education: N/A   Occupational History  . Not  on file.   Social History Main Topics  . Smoking status: Never Smoker   . Smokeless tobacco: Not on file  . Alcohol Use: 1.0 oz/week    2 drink(s) per week  . Drug Use: No  . Sexual Activity: Yes   Other Topics Concern  . Not on file   Social History Narrative    Review of Systems  Eyes: Negative.   Respiratory: Negative.   Cardiovascular: Negative.   Neurological: Positive for dizziness (resolved), tremors (shakiness), weakness and headaches. Negative for seizures.  Psychiatric/Behavioral: Negative for suicidal ideas and substance abuse. The patient is nervous/anxious.      Objective:   Filed Vitals:   08/24/14 0924  BP: 125/81  Pulse: 94  Temp: 98.2 F (36.8 C)  Resp: 16    Physical Exam  Constitutional: He is oriented to person, place, and time.  Cardiovascular: Normal rate, regular rhythm and normal heart sounds.   Pulmonary/Chest: Effort normal and breath sounds normal.  Abdominal: Soft. Bowel sounds are normal.  Musculoskeletal: Normal range of motion. He exhibits no edema.  Neurological: He is alert and oriented to person, place, and time.  Skin: Skin is warm and dry.     Lab Results  Component Value Date   WBC 9.7 05/31/2014  HGB 15.1 05/31/2014   HCT 42.8 05/31/2014   MCV 89.5 05/31/2014   PLT 326 05/31/2014   Lab Results  Component Value Date   CREATININE 0.97 05/31/2014   BUN 14 05/31/2014   NA 139 05/31/2014   K 4.4 05/31/2014   CL 102 05/31/2014   CO2 20 05/31/2014    Lab Results  Component Value Date   HGBA1C 5.7 06/09/2014   Lipid Panel     Component Value Date/Time   CHOL 144 06/09/2014 1304   TRIG 80 06/09/2014 1304   HDL 55 06/09/2014 1304   CHOLHDL 2.6 06/09/2014 1304   VLDL 16 06/09/2014 1304   LDLCALC 73 06/09/2014 1304       Assessment and plan:   Shawn HuaDavid was seen today for follow-up.  Diagnoses and associated orders for this visit:  Essential hypertension Patient blood pressure is stable and may continue on  off lisinopril and will recheck in 3 weeks to see if he will need to begin back on medication.  Education on diet, exercise, and modifiable risk factors discussed. Will obtain appropriate labs as needed. Will follow up in 3-6 months.   Anxiety - ALPRAZolam (XANAX) 0.25 MG tablet; Take 1 tablet (0.25 mg total) by mouth 2 (two) times daily as needed for anxiety. Discussed with patient that anxiety symptoms are her body's response to stress. Discussed the possible need to initiate SSRI. Discussed that benzo's are habit forming and should only be used when symptoms are severe and this will not be a cyclic fill drug.  Will follow up with patient in 4 weeks to determine further needs.   Abnormal glucose - POCT glucose (manual entry) - POCT glycosylated hemoglobin (Hb A1C)--5.4  Refused influenza vaccine Explained that annual influenza is recommended per CDC guidelines and is highly suggested to anyone who has has CHF, COPD, DM or immunocompromised. Benefits of influenza described in detail.    Return in about 3 weeks (around 09/14/2014) for Nurse Visit-BP check and 3 mo PCP.       Holland CommonsKECK, VALERIE, NP-C [facility] [phone] 08/24/2014, 9:32 AM

## 2014-08-24 NOTE — Progress Notes (Signed)
Pt is here following up on his HTN. Pt quit taking his BP medications due to side effects w/ bad metal taste,feeling dizzy and weakness. Pt has had more anxiety lately b/c his girlfriend left him.

## 2014-08-24 NOTE — Patient Instructions (Signed)
Avoid foods that are high in sodium such as canned soups and vegetables, tomato juice, commercial baked goods, commercially prepared frozen or canned entrees and sauces. Avoid salty snacks, added salt when cooking, and substituting for low sodium herbs or spices. exercise regimen of cardio at least three times weekly to help lower BP and cholesterol.  Generalized Anxiety Disorder Generalized anxiety disorder (GAD) is a mental disorder. It interferes with life functions, including relationships, work, and school. GAD is different from normal anxiety, which everyone experiences at some point in their lives in response to specific life events and activities. Normal anxiety actually helps us prepare for and get through these life events and activities. Normal anxiety goes away after the event or activity is over.  GAD causes anxiety that is not necessarily related to specific events or activities. It also causes excess anxiety in proportion to specific events or activities. The anxiety associated with GAD is also difficult to control. GAD can vary from mild to severe. People with severe GAD can have intense waves of anxiety with physical symptoms (panic attacks).  SYMPTOMS The anxiety and worry associated with GAD are difficult to control. This anxiety and worry are related to many life events and activities and also occur more days than not for 6 months or longer. People with GAD also have three or more of the following symptoms (one or more in children):  Restlessness.   Fatigue.  Difficulty concentrating.   Irritability.  Muscle tension.  Difficulty sleeping or unsatisfying sleep. DIAGNOSIS GAD is diagnosed through an assessment by your health care provider. Your health care provider will ask you questions aboutyour mood,physical symptoms, and events in your life. Your health care provider may ask you about your medical history and use of alcohol or drugs, including prescription medicines.  Your health care provider may also do a physical exam and blood tests. Certain medical conditions and the use of certain substances can cause symptoms similar to those associated with GAD. Your health care provider may refer you to a mental health specialist for further evaluation. TREATMENT The following therapies are usually used to treat GAD:   Medication. Antidepressant medication usually is prescribed for long-term daily control. Antianxiety medicines may be added in severe cases, especially when panic attacks occur.   Talk therapy (psychotherapy). Certain types of talk therapy can be helpful in treating GAD by providing support, education, and guidance. A form of talk therapy called cognitive behavioral therapy can teach you healthy ways to think about and react to daily life events and activities.  Stress managementtechniques. These include yoga, meditation, and exercise and can be very helpful when they are practiced regularly. A mental health specialist can help determine which treatment is best for you. Some people see improvement with one therapy. However, other people require a combination of therapies. Document Released: 01/18/2013 Document Revised: 02/07/2014 Document Reviewed: 01/18/2013 [facility] Patient Information 2015 [employer], [place]. This information is not intended to replace advice given to you by your health care provider. Make sure you discuss any questions you have with your health care provider.

## 2014-09-15 ENCOUNTER — Ambulatory Visit: Payer: Self-pay | Attending: Internal Medicine | Admitting: *Deleted

## 2014-09-15 VITALS — BP 93/58 | HR 72 | Temp 98.0°F | Resp 14

## 2014-09-15 DIAGNOSIS — I10 Essential (primary) hypertension: Secondary | ICD-10-CM | POA: Insufficient documentation

## 2014-09-15 NOTE — Progress Notes (Signed)
Patient presents for BP check following discontinuation of lisinopril one month ago due to side effects Med list reviewed; states taking alprazolam prn Patient states he has been lowering sodium content in diet Declined flu vaccine  BP 93/58 P 72 R  14 T  98.0 oral SPO2  99%  Patient advised to stand slowly from lying or reclining position due to today's BP reading Patient aware that he is to f/u with PCP 3 months from last visit (Due 11/24/14)

## 2014-11-04 ENCOUNTER — Encounter (HOSPITAL_COMMUNITY): Payer: Self-pay

## 2014-11-04 ENCOUNTER — Emergency Department (INDEPENDENT_AMBULATORY_CARE_PROVIDER_SITE_OTHER)
Admission: EM | Admit: 2014-11-04 | Discharge: 2014-11-04 | Disposition: A | Discharge status: Home / Self Care | Payer: Self-pay | Source: Home / Self Care | Attending: Emergency Medicine | Admitting: Emergency Medicine

## 2014-11-04 DIAGNOSIS — R059 Cough, unspecified: Secondary | ICD-10-CM

## 2014-11-04 DIAGNOSIS — Z2089 Contact with and (suspected) exposure to other communicable diseases: Secondary | ICD-10-CM

## 2014-11-04 DIAGNOSIS — Z20818 Contact with and (suspected) exposure to other bacterial communicable diseases: Secondary | ICD-10-CM

## 2014-11-04 DIAGNOSIS — R05 Cough: Secondary | ICD-10-CM

## 2014-11-04 LAB — POCT RAPID STREP A: STREPTOCOCCUS, GROUP A SCREEN (DIRECT): NEGATIVE

## 2014-11-04 MED ORDER — AMOXICILLIN 500 MG PO CAPS: 500.0000 mg | ORAL_CAPSULE | Freq: Three times a day (TID) | ORAL | Status: DC

## 2014-11-04 NOTE — ED Provider Notes (Signed)
Chief Complaint   Sore Throat   History of Present Illness   Rondall AllegraDavid E Pavlock is a 44 year old male who's had a two-week history of a cough productive yellow sputum and wheezing. He's been exposed to strep with 2 family members having strep. He has a slight sore throat, nasal congestion with yellow, bloody drainage, headache, right ear pain, pressure behind his eyes, chills, and nausea. He denies any fever, vomiting, or diarrhea.  Review of Systems   Other than as noted above, the patient denies any of the following symptoms: Systemic:  No fevers, chills, sweats, or myalgias. Eye:  No redness or discharge. ENT:  No ear pain, headache, nasal congestion, drainage, sinus pressure, or sore throat. Neck:  No neck pain, stiffness, or swollen glands. Lungs:  No cough, sputum production, hemoptysis, wheezing, chest tightness, shortness of breath or chest pain. GI:  No abdominal pain, nausea, vomiting or diarrhea.  PMFSH   Past medical history, family history, social history, meds, and allergies were reviewed.   Physical exam   Vital signs:  BP 145/93 mmHg  Pulse 72  Temp(Src) 98.5 F (36.9 C) (Oral)  Resp 16  SpO2 96% General:  Alert and oriented.  In no distress.  Skin warm and dry. Eye:  No conjunctival injection or drainage. Lids were normal. ENT:  His left TM was slightly erythematous, but he states he's had surgery on this ear in the past. His right TM looked okay, but the canal was somewhat swollen. Patient states this is been a long-standing problem.  Nasal mucosa was clear and uncongested, without drainage.  Mucous membranes were moist.  Pharynx was clear with no exudate or drainage.  There were no oral ulcerations or lesions. Neck:  Supple, no adenopathy, tenderness or mass. Lungs:  No respiratory distress.  Lungs were clear to auscultation, without wheezes, rales or rhonchi.  Breath sounds were clear and equal bilaterally.  Heart:  Regular rhythm, without gallops, murmers or  rubs. Skin:  Clear, warm, and dry, without rash or lesions.  Labs   Results for orders placed or performed during the hospital encounter of 11/04/14  POCT rapid strep A [facility])  Result Value Ref Range   Streptococcus, Group A Screen (Direct) NEGATIVE NEGATIVE    Assessment     The primary encounter diagnosis was Strep throat exposure. A diagnosis of Cough was also pertinent to this visit.  There is no evidence of pneumonia, strep throat, sinusitis, otitis media.  Since she's been exposed to strep or go ahead and treat even with the negative rapid strep.  Plan    1.  Meds:  The following meds were prescribed:   Discharge Medication List as of 11/04/2014  5:41 PM    START taking these medications   Details  amoxicillin (AMOXIL) 500 MG capsule Take 1 capsule (500 mg total) by mouth 3 (three) times daily., Starting 11/04/2014, Until Discontinued, Normal        2.  Patient Education/Counseling:  The patient was given appropriate handouts, self care instructions, and instructed in symptomatic relief.  Instructed to get extra fluids and extra rest.    3.  Follow up:  The patient was told to follow up here if no better in 3 to 4 days, or sooner if becoming worse in any way, and given some red flag symptoms such as increasing fever, difficulty breathing, chest pain, or persistent vomiting which would prompt immediate return.       Reuben Likesavid C Joyia Riehle, MD 11/04/14  2052 

## 2014-11-04 NOTE — Discharge Instructions (Signed)

## 2014-11-04 NOTE — ED Notes (Signed)
Daughter here earlier today and was dx as having strep. C/o sick x 1 week, getting worse

## 2014-11-07 ENCOUNTER — Telehealth (HOSPITAL_COMMUNITY): Payer: Self-pay | Admitting: *Deleted

## 2014-11-07 LAB — CULTURE, GROUP A STREP

## 2014-11-07 NOTE — ED Notes (Addendum)
Throat culture: Strep beta hemolytic not group A.  Pt. adequately treated with Amoxicillin.  I called pt., but number is not in service.  Call 1. Vassie MoselleYork, Cyann Venti M 11/07/2014 Unable to reach pt. by phone x 3.  Confidential marked letter sent with result and instructions. 11/17/2014

## 2014-11-08 NOTE — Progress Notes (Signed)
Quick Note:  Results are abnormal as noted, but have been adequately treated. No further action necessary. Adequately treated with amoxicillin. ______ 

## 2014-11-09 ENCOUNTER — Telehealth: Payer: Self-pay

## 2014-11-09 NOTE — Telephone Encounter (Signed)
-----   Message from Ambrose FinlandValerie A Keck, NP sent at 11/08/2014  6:53 PM EST ----- Please call patient and let him know that he needs to finish the antibiotics given to him by urgent care because he came back positive for Strep throat.  Thanks

## 2014-11-09 NOTE — Telephone Encounter (Signed)
Attempted to call, number not in service.

## 2016-05-10 ENCOUNTER — Encounter (HOSPITAL_COMMUNITY): Payer: Self-pay | Admitting: Emergency Medicine

## 2016-05-10 ENCOUNTER — Emergency Department (HOSPITAL_COMMUNITY)
Admission: EM | Admit: 2016-05-10 | Discharge: 2016-05-10 | Disposition: A | Discharge status: Home / Self Care | Payer: Self-pay | Attending: Emergency Medicine | Admitting: Emergency Medicine

## 2016-05-10 DIAGNOSIS — I10 Essential (primary) hypertension: Secondary | ICD-10-CM | POA: Insufficient documentation

## 2016-05-10 DIAGNOSIS — R21 Rash and other nonspecific skin eruption: Secondary | ICD-10-CM | POA: Insufficient documentation

## 2016-05-10 DIAGNOSIS — J45909 Unspecified asthma, uncomplicated: Secondary | ICD-10-CM | POA: Insufficient documentation

## 2016-05-10 DIAGNOSIS — Z79899 Other long term (current) drug therapy: Secondary | ICD-10-CM | POA: Insufficient documentation

## 2016-05-10 MED ORDER — HYDROXYZINE HCL 25 MG PO TABS: 25.0000 mg | ORAL_TABLET | Freq: Four times a day (QID) | ORAL | 0 refills | Status: DC

## 2016-05-10 MED ORDER — PREDNISONE 10 MG PO TABS
10.0000 mg | ORAL_TABLET | Freq: Every day | ORAL | 0 refills | Status: DC
Start: 2016-05-10 — End: 2019-06-18

## 2016-05-10 NOTE — ED Provider Notes (Signed)
[facility] Provider Note   CSN: [id number] Arrival date & time: 05/10/16  1801  First Provider Contact:  None    By signing my name below, I, Shawn Macias, attest that this documentation has been prepared under the direction and in the presence of [name], PA-C*. Electronically Signed: [name], ED Scribe. 05/10/16. 7:38 PM.    History   Chief Complaint Chief Complaint  Patient presents with  . Rash    HPI Comments: Shawn Macias is a 45 y.o. male who presents to the Emergency Department complaining of ongoing itchy rash to her left arm onset one month ago. He notes that the rash started on his lower arm and gradually moved up to his upper arm. It began after he had been working underneath a house. He adds that it feels as though "something is crawling on him". He reports associated decreased appetite. He states that he scratched a tick on his posterior neck after onset that could have been there for less than 3 days. No alleviating factors noted. Denies new medications, lotions, or soaps. He states that he has tried Calamine lotion and Benadryl lotion with no relief. No fever, chills, Headache, myalgias, arthralgias, n/v, SOB, trouble swallowing, or lip/tongue swelling.    The history is provided by the patient. No language interpreter was used.    Past Medical History:  Diagnosis Date  . Asthma     Patient Active Problem List   Diagnosis Date Noted  . HTN (hypertension) 08/24/2014    Past Surgical History:  Procedure Laterality Date  . INNER EAR SURGERY Left        Home Medications    Prior to Admission medications   Medication Sig Start Date End Date Taking? Authorizing Provider  ALPRAZolam (XANAX) 0.25 MG tablet Take 1 tablet (0.25 mg total) by mouth 2 (two) times daily as needed for anxiety. 08/24/14   Ambrose Finland, NP  amoxicillin (AMOXIL) 500 MG capsule Take 1 capsule (500 mg total) by mouth 3 (three) times daily. 11/04/14   Reuben Likes, MD  bacitracin 500 UNIT/GM ointment Apply 1 application topically 2 (two) times daily. Patient not taking: Reported on 09/15/2014 06/09/14   Ambrose Finland, NP  hydrOXYzine (ATARAX/VISTARIL) 25 MG tablet Take 1 tablet (25 mg total) by mouth every 6 (six) hours. 05/10/16   [name], PA-C  lisinopril (PRINIVIL,ZESTRIL) 10 MG tablet Take 1 tablet (10 mg total) by mouth daily. Patient not taking: Reported on 09/15/2014 06/09/14   Ambrose Finland, NP  predniSONE (DELTASONE) 10 MG tablet Take 1 tablet (10 mg total) by mouth daily with breakfast. Day 1: Take 6 tablets PO at once Day 2: Take 5 tablets PO at once Day 3: Take 4 tablets PO at once Day 4: Take 3 tablets PO at once Day 5: Take 2 tablets PO at once Day 6: Take 1 tablet PO at once 05/10/16   [name], PA-C    Family History Family History  Problem Relation Age of Onset  . Diabetes Mother   . Heart disease Mother   . Hypertension Mother   . Heart disease Brother   . Hypertension Brother   . Diabetes Maternal Grandmother   . Heart disease Maternal Grandmother   . Hypertension Maternal Grandmother   . Stroke Paternal Grandmother   . Stroke Paternal Grandfather     Social History Social History  Substance Use Topics  . Smoking status: Never Smoker  . Smokeless tobacco: Not on file  .  Alcohol use 1.0 oz/week    2 drink(s) per week     Allergies   Review of patient's allergies indicates no known allergies.   Review of Systems Review of Systems  Constitutional: Positive for appetite change. Negative for chills and fever.  HENT: Negative for trouble swallowing.   Respiratory: Negative for shortness of breath.   Skin: Positive for rash.     Physical Exam Updated Vital Signs BP 120/89 (BP Location: Right Arm)   Pulse 74   Temp 98.6 F (37 C) (Oral)   Resp 18   SpO2 99%   Physical Exam  Constitutional: He is oriented to person, place, and time. He appears well-developed and well-nourished.  HENT:  Head:  Normocephalic and atraumatic.  Right Ear: External ear normal.  Left Ear: External ear normal.  Eyes: Conjunctivae are normal. No scleral icterus.  Neck: No tracheal deviation present.  Cardiovascular:  Pulses:      Radial pulses are 2+ on the right side, and 2+ on the left side.  Pulmonary/Chest: Effort normal. No respiratory distress.  Abdominal: He exhibits no distension.  Musculoskeletal: Normal range of motion.  Moves all extremities spontaneously without pain.  Neurological: He is alert and oriented to person, place, and time.  Strength and sensation intact.  Skin: Skin is warm and dry.  Nontender, erythematous, circumferential maculopapular rash on the distal forearm to mid humerus. No vesicles. No drainage. No warmth, induration, or fluctuance. It is not in a dermatomal pattern.  Psychiatric: He has a normal mood and affect. His behavior is normal.     ED Treatments / Results  DIAGNOSTIC STUDIES: Oxygen Saturation is 99% on RA, normal by my interpretation.    COORDINATION OF CARE: 7:36 PM- Pt advised of plan for treatment and pt agrees.    Labs (all labs ordered are listed, but only abnormal results are displayed) Labs Reviewed - No data to display  EKG  EKG Interpretation None       Radiology No results found.  Procedures Procedures (including critical care time)  Medications Ordered in ED Medications - No data to display   Initial Impression / Assessment and Plan / ED Course  [name], PA-C has reviewed the triage vital signs and the nursing notes.  Pertinent labs & imaging results that were available during my care of the patient were reviewed by me and considered in my medical decision making (see chart for details).  Clinical Course   Patient presents with erythematous, noninfectious, maculopapular rash that is not a dermatomal pattern. There are no vesicles. No systemic symptoms. DSS, NAD. Doubt shingles. Doubt RMSF or Lyme disease. This does not  appear cellulitic. Likely contact dermatitis. We will discharge home with prednisone taper and Atarax. Return precautions discussed. Patient agrees and acknowledges the above plan for discharge.   Final Clinical Impressions(s) / ED Diagnoses   Final diagnoses:  Rash   I personally performed the services described in this documentation, which was scribed in my presence. The recorded information has been reviewed and is accurate.   New Prescriptions New Prescriptions   HYDROXYZINE (ATARAX/VISTARIL) 25 MG TABLET    Take 1 tablet (25 mg total) by mouth every 6 (six) hours.   PREDNISONE (DELTASONE) 10 MG TABLET    Take 1 tablet (10 mg total) by mouth daily with breakfast. Day 1: Take 6 tablets PO at once Day 2: Take 5 tablets PO at once Day 3: Take 4 tablets PO at once Day 4: Take 3 tablets PO  at once Day 5: Take 2 tablets PO at once Day 6: Take 1 tablet PO at once     [name], PA-C 05/10/16 2021    Mancel Bale, MD 05/11/16 0030

## 2016-05-10 NOTE — ED Triage Notes (Addendum)
Pt sts rash to left arm x 1 month with itching and OTC meds not working; pt sts did have tick on his neck

## 2016-05-10 NOTE — ED Notes (Signed)
Patient able to ambulate independently  

## 2017-04-21 ENCOUNTER — Emergency Department (HOSPITAL_COMMUNITY)
Admission: EM | Admit: 2017-04-21 | Discharge: 2017-04-21 | Disposition: A | Discharge status: Home / Self Care | Payer: Self-pay | Attending: Emergency Medicine | Admitting: Emergency Medicine

## 2017-04-21 ENCOUNTER — Encounter (HOSPITAL_COMMUNITY): Payer: Self-pay | Admitting: Emergency Medicine

## 2017-04-21 DIAGNOSIS — R21 Rash and other nonspecific skin eruption: Secondary | ICD-10-CM | POA: Insufficient documentation

## 2017-04-21 DIAGNOSIS — I10 Essential (primary) hypertension: Secondary | ICD-10-CM | POA: Insufficient documentation

## 2017-04-21 DIAGNOSIS — J45909 Unspecified asthma, uncomplicated: Secondary | ICD-10-CM | POA: Insufficient documentation

## 2017-04-21 MED ORDER — TRIAMCINOLONE ACETONIDE 0.5 % EX OINT
1.0000 | TOPICAL_OINTMENT | Freq: Two times a day (BID) | CUTANEOUS | 0 refills | Status: DC
Start: 2017-04-21 — End: 2019-06-18

## 2017-04-21 NOTE — Discharge Instructions (Signed)
Please follow-up with dermatology if your symptoms do not improve or worsen.

## 2017-04-21 NOTE — ED Triage Notes (Signed)
Pt reports having rash to left arm that has been present for over a month pt states it will occasionally itch and burn. Pt states the rash is worse at night time.

## 2017-04-22 NOTE — ED Provider Notes (Signed)
[facility] Provider Note   CSN: [id number] Arrival date & time: 04/21/17  1922     History   Chief Complaint Chief Complaint  Patient presents with  . Rash    HPI Shawn Macias is a 46 y.o. male who presents with a rash to his left arm that has been present for approximately 1 month. He states that occasionally it itches and burns. He denies any other symptoms, says that he also has spots from the rash on his back and a few scattered spots on his right arm.  He reports that he has tried hydrocortisone cream and lotion on the area without any relief.  Denies fevers, nausea/vomiting or chills   HPI  Past Medical History:  Diagnosis Date  . Asthma     Patient Active Problem List   Diagnosis Date Noted  . HTN (hypertension) 08/24/2014    Past Surgical History:  Procedure Laterality Date  . INNER EAR SURGERY Left        Home Medications    Prior to Admission medications   Medication Sig Start Date End Date Taking? Authorizing Provider  ALPRAZolam (XANAX) 0.25 MG tablet Take 1 tablet (0.25 mg total) by mouth 2 (two) times daily as needed for anxiety. 08/24/14   Ambrose FinlandKeck, Valerie A, NP  amoxicillin (AMOXIL) 500 MG capsule Take 1 capsule (500 mg total) by mouth 3 (three) times daily. 11/04/14   Reuben LikesKeller, Shlomo C, MD  bacitracin 500 UNIT/GM ointment Apply 1 application topically 2 (two) times daily. Patient not taking: Reported on 09/15/2014 06/09/14   Ambrose FinlandKeck, Valerie A, NP  hydrOXYzine (ATARAX/VISTARIL) 25 MG tablet Take 1 tablet (25 mg total) by mouth every 6 (six) hours. 05/10/16   Cheri Fowlerose, Kayla, PA-C  lisinopril (PRINIVIL,ZESTRIL) 10 MG tablet Take 1 tablet (10 mg total) by mouth daily. Patient not taking: Reported on 09/15/2014 06/09/14   Ambrose FinlandKeck, Valerie A, NP  predniSONE (DELTASONE) 10 MG tablet Take 1 tablet (10 mg total) by mouth daily with breakfast. Day 1: Take 6 tablets PO at once Day 2: Take 5 tablets PO at once Day 3: Take 4 tablets PO at once Day 4: Take 3 tablets PO  at once Day 5: Take 2 tablets PO at once Day 6: Take 1 tablet PO at once 05/10/16   Cheri Fowlerose, Kayla, PA-C  triamcinolone ointment (KENALOG) 0.5 % Apply 1 application topically 2 (two) times daily. 04/21/17   Cristina GongHammond, Oather Muilenburg W, PA-C    Family History Family History  Problem Relation Age of Onset  . Diabetes Mother   . Heart disease Mother   . Hypertension Mother   . Heart disease Brother   . Hypertension Brother   . Diabetes Maternal Grandmother   . Heart disease Maternal Grandmother   . Hypertension Maternal Grandmother   . Stroke Paternal Grandmother   . Stroke Paternal Grandfather     Social History Social History  Substance Use Topics  . Smoking status: Never Smoker  . Smokeless tobacco: Never Used  . Alcohol use 1.0 oz/week    2 Standard drinks or equivalent per week     Allergies   Patient has no known allergies.   Review of Systems Review of Systems  Constitutional: Negative for activity change, chills, fatigue and fever.  Respiratory: Negative for chest tightness and shortness of breath.   Skin: Positive for rash. Negative for wound.     Physical Exam Updated Vital Signs BP (!) 132/97   Pulse 64   Temp 98.2 F (36.8 C) (  Oral)   Resp 18   Ht 5\' 10"  (1.778 m)   Wt 74.8 kg (165 lb)   SpO2 98%   BMI 23.68 kg/m   Physical Exam  Constitutional: He appears well-developed and well-nourished. No distress.  Pulmonary/Chest: Effort normal. No respiratory distress.  Neurological: He is alert.  Skin: Skin is warm and dry. Rash noted. He is not diaphoretic.  Red raised rash is present, mostly on left forearm. No rash in between fingers or on hand.  No obvious burrows. Rash appears excoriated.  Rash is not consistent with hives.  Psychiatric: He has a normal mood and affect. His behavior is normal.  Nursing note and vitals reviewed.    ED Treatments / Results  Labs (all labs ordered are listed, but only abnormal results are displayed) Labs Reviewed - No data  to display  EKG  EKG Interpretation None       Radiology No results found.  Procedures Procedures (including critical care time)  Medications Ordered in ED Medications - No data to display   Initial Impression / Assessment and Plan / ED Course  I have reviewed the triage vital signs and the nursing notes.  Pertinent labs & imaging results that were available during my care of the patient were reviewed by me and considered in my medical decision making (see chart for details).    Rash consistent with contact irritant dermatitis from unknown source. Patient denies any difficulty breathing or swallowing.  Pt has a patent airway without stridor and is handling secretions without difficulty; no angioedema. No blisters, dermatomal pattern, no pustules, no warmth, no draining sinus tracts, no superficial abscesses, no bullous impetigo, no vesicles, no desquamation, no target lesions with dusky purpura or a central bulla. Not tender to touch. No concern for superimposed infection. No concern for SJS, TEN, TSS, tick borne illness, syphilis or other life-threatening condition.   Will discharge home with triamcinolone ointment, instructions to follow up with dermatology if rash persists or worsens.   Final Clinical Impressions(s) / ED Diagnoses   Final diagnoses:  Rash  Rash and nonspecific skin eruption    New Prescriptions Discharge Medication List as of 04/21/2017 10:38 PM    START taking these medications   Details  triamcinolone ointment (KENALOG) 0.5 % Apply 1 application topically 2 (two) times daily., Starting Mon 04/21/2017, Print         Cristina Gong, PA-C 04/22/17 Lacretia Leigh    Pricilla Loveless, MD 04/22/17 [phone]

## 2018-04-03 ENCOUNTER — Ambulatory Visit (HOSPITAL_COMMUNITY)
Admission: EM | Admit: 2018-04-03 | Discharge: 2018-04-03 | Disposition: A | Discharge status: Home / Self Care | Payer: Self-pay | Attending: Family Medicine | Admitting: Family Medicine

## 2018-04-03 DIAGNOSIS — R369 Urethral discharge, unspecified: Secondary | ICD-10-CM | POA: Insufficient documentation

## 2018-04-03 DIAGNOSIS — R300 Dysuria: Secondary | ICD-10-CM | POA: Insufficient documentation

## 2018-04-03 LAB — POCT URINALYSIS DIP (DEVICE)
BILIRUBIN URINE: NEGATIVE
Glucose, UA: NEGATIVE mg/dL
Ketones, ur: NEGATIVE mg/dL
NITRITE: NEGATIVE
Protein, ur: 30 mg/dL — AB
Specific Gravity, Urine: 1.025 (ref 1.005–1.030)
Urobilinogen, UA: 0.2 mg/dL (ref 0.0–1.0)
pH: 6 (ref 5.0–8.0)

## 2018-04-03 MED ORDER — CEFTRIAXONE SODIUM 250 MG IJ SOLR
250.0000 mg | Freq: Once | INTRAMUSCULAR | Status: AC
  Administered 2018-04-03: 250 mg via INTRAMUSCULAR

## 2018-04-03 MED ORDER — AZITHROMYCIN 250 MG PO TABS
1000.0000 mg | ORAL_TABLET | Freq: Once | ORAL | Status: AC
Start: 2018-04-03 — End: 2018-04-03
  Administered 2018-04-03: 1000 mg via ORAL

## 2018-04-03 MED ORDER — AZITHROMYCIN 250 MG PO TABS
ORAL_TABLET | ORAL | Status: AC
  Filled 2018-04-03: qty 4

## 2018-04-03 MED ORDER — CEFTRIAXONE SODIUM 250 MG IJ SOLR
INTRAMUSCULAR | Status: AC
  Filled 2018-04-03: qty 250

## 2018-04-03 NOTE — ED Triage Notes (Signed)
Pt c/o burning with urination.

## 2018-04-03 NOTE — Discharge Instructions (Signed)
We have treated you today for gonorrhea and chlamydia, with Rocephin and azithromycin. Please refrain from sexual activity for 7 days while medicine is clearing infection.  We have sent your urine off for culture to check for infection in the urine I swabbed the lesions for herpes, the results for this will come back in approximately 3 days We are testing you for Gonorrhea, Chlamydia and Trichomonas. We will call you if anything is positive and let you know if you require any further treatment. Please inform partner of any positive results.  Please return if symptoms not improving with treatment, development of fever, nausea, vomiting, abdominal pain, scrotal pain.

## 2018-04-04 NOTE — ED Provider Notes (Signed)
[facility]    CSN: [id number] Arrival date & time: 04/03/18  1845     History   Chief Complaint Chief Complaint  Patient presents with  . Dysuria    HPI Shawn Macias is a 46 y.o. male presenting today for evaluation of dysuria. Patient has had burning with urination for the past 3-5 days as well as noting discharge. He notes symptoms began a couple days after having sexual intercourse with his partner while she was on her cycle. She is not a new partner, but he does note a recent 1 month separation. He has also noticed small red bumps near the base of his penis that he initially believe to be where pubic hair was removed. He denies concern for STD's. Denies history of prostate issues. Denies hesitancy, decreased stream.   HPI  Past Medical History:  Diagnosis Date  . Asthma     Patient Active Problem List   Diagnosis Date Noted  . HTN (hypertension) 08/24/2014    Past Surgical History:  Procedure Laterality Date  . INNER EAR SURGERY Left        Home Medications    Prior to Admission medications   Medication Sig Start Date End Date Taking? Authorizing Provider  ALPRAZolam (XANAX) 0.25 MG tablet Take 1 tablet (0.25 mg total) by mouth 2 (two) times daily as needed for anxiety. Patient not taking: Reported on 04/03/2018 08/24/14   Ambrose Finland, NP  amoxicillin (AMOXIL) 500 MG capsule Take 1 capsule (500 mg total) by mouth 3 (three) times daily. Patient not taking: Reported on 04/03/2018 11/04/14   Reuben Likes, MD  bacitracin 500 UNIT/GM ointment Apply 1 application topically 2 (two) times daily. Patient not taking: Reported on 09/15/2014 06/09/14   Ambrose Finland, NP  hydrOXYzine (ATARAX/VISTARIL) 25 MG tablet Take 1 tablet (25 mg total) by mouth every 6 (six) hours. 05/10/16   [name], PA-C  lisinopril (PRINIVIL,ZESTRIL) 10 MG tablet Take 1 tablet (10 mg total) by mouth daily. Patient not taking: Reported on 09/15/2014 06/09/14   Ambrose Finland, NP    predniSONE (DELTASONE) 10 MG tablet Take 1 tablet (10 mg total) by mouth daily with breakfast. Day 1: Take 6 tablets PO at once Day 2: Take 5 tablets PO at once Day 3: Take 4 tablets PO at once Day 4: Take 3 tablets PO at once Day 5: Take 2 tablets PO at once Day 6: Take 1 tablet PO at once Patient not taking: Reported on 04/03/2018 05/10/16   [name], PA-C  triamcinolone ointment (KENALOG) 0.5 % Apply 1 application topically 2 (two) times daily. Patient not taking: Reported on 04/03/2018 04/21/17   Cristina Gong, PA-C    Family History Family History  Problem Relation Age of Onset  . Diabetes Mother   . Heart disease Mother   . Hypertension Mother   . Heart disease Brother   . Hypertension Brother   . Diabetes Maternal Grandmother   . Heart disease Maternal Grandmother   . Hypertension Maternal Grandmother   . Stroke Paternal Grandmother   . Stroke Paternal Grandfather     Social History Social History   Tobacco Use  . Smoking status: Never Smoker  . Smokeless tobacco: Never Used  Substance Use Topics  . Alcohol use: Yes    Alcohol/week: 1.2 oz    Types: 2 Standard drinks or equivalent per week  . Drug use: No     Allergies   Patient has no known  allergies.   Review of Systems Review of Systems  Constitutional: Negative for fever.  HENT: Negative for sore throat.   Respiratory: Negative for shortness of breath.   Cardiovascular: Negative for chest pain.  Gastrointestinal: Negative for abdominal pain, nausea and vomiting.  Genitourinary: Positive for discharge, dysuria and genital sores. Negative for difficulty urinating, frequency, penile pain, penile swelling, scrotal swelling and testicular pain.  Skin: Negative for rash.  Neurological: Negative for dizziness, light-headedness and headaches.     Physical Exam Triage Vital Signs ED Triage Vitals  Enc Vitals Group     BP 04/03/18 1915 (!) 145/84     Pulse Rate 04/03/18 1914 71     Resp 04/03/18  1914 16     Temp 04/03/18 1914 98.3 F (36.8 C)     Temp src --      SpO2 04/03/18 1914 99 %     Weight --      Height --      Head Circumference --      Peak Flow --      Pain Score 04/03/18 1915 0     Pain Loc --      Pain Edu? --      Excl. in GC? --    No data found.  Updated Vital Signs BP (!) 145/84   Pulse 71   Temp 98.3 F (36.8 C)   Resp 16   SpO2 99%   Visual Acuity Right Eye Distance:   Left Eye Distance:   Bilateral Distance:    Right Eye Near:   Left Eye Near:    Bilateral Near:     Physical Exam  Constitutional: He is oriented to person, place, and time. He appears well-developed and well-nourished.  No acute distress  HENT:  Head: Normocephalic and atraumatic.  Nose: Nose normal.  Eyes: Conjunctivae are normal.  Neck: Neck supple.  Cardiovascular: Normal rate.  Pulmonary/Chest: Effort normal. No respiratory distress.  Abdominal: He exhibits no distension.  Genitourinary:  Genitourinary Comments: Milky white discharge present in urethral meatus Erythematous circular raised lesions near base of penis shaft. Non tender to palpation.  Musculoskeletal: Normal range of motion.  Neurological: He is alert and oriented to person, place, and time.  Skin: Skin is warm and dry.  Psychiatric: He has a normal mood and affect.  Nursing note and vitals reviewed.    UC Treatments / Results  Labs (all labs ordered are listed, but only abnormal results are displayed) Labs Reviewed  POCT URINALYSIS DIP (DEVICE) - Abnormal; Notable for the following components:      Result Value   Hgb urine dipstick TRACE (*)    Protein, ur 30 (*)    Leukocytes, UA SMALL (*)    All other components within normal limits  HSV CULTURE AND TYPING  URINE CULTURE  URINE CYTOLOGY ANCILLARY ONLY    EKG None  Radiology No results found.  Procedures Procedures (including critical care time)  Medications Ordered in UC Medications  cefTRIAXone (ROCEPHIN) injection 250 mg  (250 mg Intramuscular Given 04/03/18 1947)  azithromycin (ZITHROMAX) tablet 1,000 mg (1,000 mg Oral Given 04/03/18 1947)    Initial Impression / Assessment and Plan / UC Course  I have reviewed the triage vital signs and the nursing notes.  Pertinent labs & imaging results that were available during my care of the patient were reviewed by me and considered in my medical decision making (see chart for details).     Patient with penile discharge.  STDs is most likely, discussed with patient, will go ahead and empirically treat with Rocephin and azithromycin.  Urine cytology obtained.  We will also send for urine culture given small leuks on UA.  Lesions at base of penis swab for HSV.  Will call patient with results and alter treatment as needed.Discussed strict return precautions. Patient verbalized understanding and is agreeable with plan.  Final Clinical Impressions(s) / UC Diagnoses   Final diagnoses:  Dysuria  Penile discharge     Discharge Instructions     We have treated you today for gonorrhea and chlamydia, with Rocephin and azithromycin. Please refrain from sexual activity for 7 days while medicine is clearing infection.  We have sent your urine off for culture to check for infection in the urine I swabbed the lesions for herpes, the results for this will come back in approximately 3 days We are testing you for Gonorrhea, Chlamydia and Trichomonas. We will call you if anything is positive and let you know if you require any further treatment. Please inform partner of any positive results.  Please return if symptoms not improving with treatment, development of fever, nausea, vomiting, abdominal pain, scrotal pain.   ED Prescriptions    None     Controlled Substance Prescriptions [place] Controlled Substance Registry consulted? Not Applicable   Lew DawesWieters, Hallie C, [place]PA-C 04/04/18 1005

## 2018-04-05 LAB — URINE CULTURE

## 2018-04-06 ENCOUNTER — Telehealth (HOSPITAL_COMMUNITY): Payer: Self-pay

## 2018-04-06 LAB — URINE CYTOLOGY ANCILLARY ONLY
Chlamydia: NEGATIVE
NEISSERIA GONORRHEA: POSITIVE — AB
TRICH (WINDOWPATH): NEGATIVE

## 2018-04-06 LAB — HSV CULTURE AND TYPING

## 2018-04-06 NOTE — Telephone Encounter (Signed)
Test for gonorrhea was positive. This was treated at the urgent care visit with IM rocephin 250mg and po zithromax 1g.Need to educate patient to refrain from sexual intercourse for 7 days after treatment to give the medicine time to work. Sexual partners need to be notified and tested/treated. Condoms may reduce risk of reinfection. Recheck or followup with PCP for further evaluation if symptoms are not improving.  [facility] notified. Attempted to reach patient. No answer at this time. Voicemail left.   

## 2018-04-07 ENCOUNTER — Telehealth (HOSPITAL_COMMUNITY): Payer: Self-pay

## 2018-04-07 NOTE — Telephone Encounter (Signed)
Attempted to reach patient x 2 

## 2018-04-10 ENCOUNTER — Telehealth (HOSPITAL_COMMUNITY): Payer: Self-pay

## 2018-04-10 NOTE — Telephone Encounter (Signed)
Attempted to reach patient x 3 ?Will send letter

## 2018-04-14 ENCOUNTER — Telehealth (HOSPITAL_COMMUNITY): Payer: Self-pay

## 2018-04-14 NOTE — Telephone Encounter (Signed)
Pt returned call and is aware of positive results. Pt complains of rash on arms and back. Encouraged to be evaluated.

## 2019-06-09 ENCOUNTER — Encounter: Payer: Self-pay | Admitting: Physician Assistant

## 2019-06-18 ENCOUNTER — Other Ambulatory Visit: Payer: Self-pay

## 2019-06-18 ENCOUNTER — Encounter: Payer: Self-pay | Admitting: Physician Assistant

## 2019-06-18 ENCOUNTER — Ambulatory Visit: Payer: Self-pay | Admitting: Physician Assistant

## 2019-06-18 VITALS — BP 122/76 | HR 72 | Temp 97.7°F | Ht 70.0 in | Wt 170.0 lb

## 2019-06-18 DIAGNOSIS — R194 Change in bowel habit: Secondary | ICD-10-CM

## 2019-06-18 DIAGNOSIS — K625 Hemorrhage of anus and rectum: Secondary | ICD-10-CM

## 2019-06-18 MED ORDER — PEG 3350-KCL-NA BICARB-NACL 420 G PO SOLR: 4000.0000 mL | Freq: Once | ORAL | 0 refills | Status: AC

## 2019-06-18 NOTE — Progress Notes (Signed)
Chief Complaint: Rectal bleeding  HPI:    Shawn Macias is a 48 year old male with a past medical history as listed below, who presents to clinic today with a complaint of rectal bleeding.    Today, patient reports that he has had rectal bleeding for the past year off and on, sometimes it can be there for a week and other times will go away for a couple of weeks.  His last bleeding was yesterday.  It is becoming more frequent.  Tells me this can be mixed in his stool and also in the toilet water.  He is also had a few "bowel movements which were just blood".  He does have a family history of Crohn's in his maternal grandmother.  Also with a small change in bowel habits lately radiating from constipation to diarrhea which is not normal for him.  This is associated with a generalized lower abdominal pain which comes and goes.    Denies fever, chills, weight loss or symptoms that awaken him from sleep.    Past Medical History:  Diagnosis Date  . Asthma     Past Surgical History:  Procedure Laterality Date  . INNER EAR SURGERY Left     Current Outpatient Medications  Medication Sig Dispense Refill  . amoxicillin (AMOXIL) 500 MG capsule Take 1 capsule (500 mg total) by mouth 3 (three) times daily. (Patient not taking: Reported on 04/03/2018) 30 capsule 0  . bacitracin 500 UNIT/GM ointment Apply 1 application topically 2 (two) times daily. (Patient not taking: Reported on 09/15/2014) 15 g 1  . hydrOXYzine (ATARAX/VISTARIL) 25 MG tablet Take 1 tablet (25 mg total) by mouth every 6 (six) hours. 12 tablet 0  . lisinopril (PRINIVIL,ZESTRIL) 10 MG tablet Take 1 tablet (10 mg total) by mouth daily. (Patient not taking: Reported on 09/15/2014) 90 tablet 3  . predniSONE (DELTASONE) 10 MG tablet Take 1 tablet (10 mg total) by mouth daily with breakfast. Day 1: Take 6 tablets PO at once Day 2: Take 5 tablets PO at once Day 3: Take 4 tablets PO at once Day 4: Take 3 tablets PO at once Day 5: Take 2  tablets PO at once Day 6: Take 1 tablet PO at once (Patient not taking: Reported on 04/03/2018) 21 tablet 0  . triamcinolone ointment (KENALOG) 0.5 % Apply 1 application topically 2 (two) times daily. (Patient not taking: Reported on 04/03/2018) 30 g 0   No current facility-administered medications for this visit.     Allergies as of 06/18/2019  . (No Known Allergies)    Family History  Problem Relation Age of Onset  . Diabetes Mother   . Heart disease Mother   . Hypertension Mother   . Heart disease Brother   . Hypertension Brother   . Diabetes Maternal Grandmother   . Heart disease Maternal Grandmother   . Hypertension Maternal Grandmother   . Stroke Paternal Grandmother   . Stroke Paternal Grandfather     Social History   Socioeconomic History  . Marital status: Single    Spouse name: Not on file  . Number of children: Not on file  . Years of education: Not on file  . Highest education level: Not on file  Occupational History  . Not on file  Social Needs  . Financial resource strain: Not on file  . Food insecurity    Worry: Not on file    Inability: Not on file  . Transportation needs    Medical: Not on  file    Non-medical: Not on file  Tobacco Use  . Smoking status: Never Smoker  . Smokeless tobacco: Never Used  Substance and Sexual Activity  . Alcohol use: Yes    Alcohol/week: 2.0 standard drinks    Types: 2 Standard drinks or equivalent per week  . Drug use: No  . Sexual activity: Yes  Lifestyle  . Physical activity    Days per week: Not on file    Minutes per session: Not on file  . Stress: Not on file  Relationships  . Social Musicianconnections    Talks on phone: Not on file    Gets together: Not on file    Attends religious service: Not on file    Active member of club or organization: Not on file    Attends meetings of clubs or organizations: Not on file    Relationship status: Not on file  . Intimate partner violence    Fear of current or ex  partner: Not on file    Emotionally abused: Not on file    Physically abused: Not on file    Forced sexual activity: Not on file  Other Topics Concern  . Not on file  Social History Narrative  . Not on file    Review of Systems:    Constitutional: No weight loss, fever or chills Skin: No rash  Cardiovascular: No chest pain Respiratory: No SOB Gastrointestinal: See HPI  Neurological: No headache, dizziness or syncope Musculoskeletal: No new muscle or joint pain Hematologic: No bruising Psychiatric: No history of depression or anxiety   Physical Exam:  Vital signs: BP 122/76   Pulse 72   Temp 97.7 F (36.5 C)   Ht 5\' 10"  (1.778 m)   Wt 170 lb (77.1 kg)   BMI 24.39 kg/m   Constitutional:   Pleasant Caucasian male appears to be in NAD, Well developed, Well nourished, alert and cooperative Head:  Normocephalic and atraumatic. Eyes:   PEERL, EOMI. No icterus. Conjunctiva pink. Ears:  Normal auditory acuity. Neck:  Supple Throat: Oral cavity and pharynx without inflammation, swelling or lesion.  Respiratory: Respirations even and unlabored. Lungs clear to auscultation bilaterally.   No wheezes, crackles, or rhonchi.  Cardiovascular: Normal S1, S2. No MRG. Regular rate and rhythm. No peripheral edema, cyanosis or pallor.  Gastrointestinal:  Soft, nondistended, nontender. No rebound or guarding. Normal bowel sounds. No appreciable masses or hepatomegaly. Rectal:  External: External hemorrhoids, ?healing fissure vs fistula? Internal: Limited due to tight sphincter,some fullness but no mass appreciated Msk:  Symmetrical without gross deformities. Without edema, no deformity or joint abnormality.  Neurologic:  Alert and  oriented x4;  grossly normal neurologically.  Skin:   Dry and intact without significant lesions or rashes. Psychiatric: Demonstrates good judgement and reason without abnormal affect or behaviors.  No recent labs or imaging.  Assessment: 1.  Rectal bleeding: For  the past year, sometimes just movements with blood, also stool radiates from diarrhea to constipation; consider Crohn's versus colitis versus hemorrhoids versus other 2.  Change in bowel habits: See above 3.  Family history of Crohn's in his maternal grandmother  Plan: 1.  Scheduled patient for colonoscopy in the [facility] with Dr. Christella HartiganJacobs.  Did discuss risks, benefits, limitations and alternatives and patient agrees to proceed. 2.  It was discussed that while the patient is under anesthesia Dr. Christella HartiganJacobs can get a better look at his rectum as he was quite tense today. 3.  Patient to follow in clinic per  recommendations after colonoscopy.  Ellouise Newer, PA-C [facility] 06/18/2019, 10:48 AM  Cc: No ref. provider found

## 2019-06-18 NOTE — Patient Instructions (Addendum)
You have been scheduled for a colonoscopy. Please follow written instructions given to you at your visit today.  Please pick up your prep supplies at the pharmacy within the next 1-3 days. If you use inhalers (even only as needed), please bring them with you on the day of your procedure.  We have sent the following medications to your pharmacy for you to pick up at your convenience:  Golytely for colonoscopy  Thank you for choosing me and [facility]  Ellouise Newer, PA-C

## 2019-06-18 NOTE — Progress Notes (Signed)
I agree with the above note, plan 

## 2019-06-21 ENCOUNTER — Telehealth: Payer: Self-pay | Admitting: Gastroenterology

## 2019-06-21 NOTE — Telephone Encounter (Signed)
Pt is scheduled for colon 9/15 and stated that he has a cold.

## 2019-06-21 NOTE — Telephone Encounter (Signed)
RS to 10/6 Tues 230 pm Ardis Hughs    Pt encouraged to call PCP  if S/S worsen for OV   Possible covid screen as he is now coughing   MAILED NEW INSTRUCTIONS TO PT FOR 10-6 Colon

## 2019-06-21 NOTE — Telephone Encounter (Signed)
Pt states he has head cold- sinuses are stopped up- irritation in back of throat- he denies fever, he denies cough- pt denies loss of taste or smell- he has congestion- did allergy meds Sunday- 46 yo daughter with runny nose as well  - he denies being exposed to COVID  Can he proceed with procedure tomorrow

## 2019-06-21 NOTE — Telephone Encounter (Signed)
Tell him we should postpone the procedure until his URI symptoms have passed.  Offer rebooking for 2-3 weeks from now.  Thanks

## 2019-06-22 ENCOUNTER — Encounter: Payer: Self-pay | Admitting: Gastroenterology

## 2019-07-13 ENCOUNTER — Encounter: Payer: Self-pay | Admitting: Gastroenterology

## 2019-07-13 ENCOUNTER — Other Ambulatory Visit: Payer: Self-pay

## 2019-07-13 ENCOUNTER — Ambulatory Visit (AMBULATORY_SURGERY_CENTER): Payer: Self-pay | Admitting: Gastroenterology

## 2019-07-13 VITALS — BP 117/80 | HR 69 | Temp 97.7°F | Resp 17 | Ht 70.0 in | Wt 170.0 lb

## 2019-07-13 DIAGNOSIS — K648 Other hemorrhoids: Secondary | ICD-10-CM

## 2019-07-13 DIAGNOSIS — K625 Hemorrhage of anus and rectum: Secondary | ICD-10-CM

## 2019-07-13 DIAGNOSIS — R194 Change in bowel habit: Secondary | ICD-10-CM

## 2019-07-13 MED ORDER — SODIUM CHLORIDE 0.9 % IV SOLN: 500.0000 mL | Freq: Once | INTRAVENOUS | Status: AC

## 2019-07-13 NOTE — Op Note (Signed)
[facility] Patient Name: Shawn Macias Procedure Date: 07/13/2019 3:02 PM MRN: [id number] Endoscopist: Rachael Fee , MD Age: 48 Referring MD:  Date of Birth: [birth year] Gender: Male Account #: [id number] Procedure:                Colonoscopy Indications:              Hematochezia Medicines:                Monitored Anesthesia Care Procedure:                Pre-Anesthesia Assessment:                           - Prior to the procedure, a History and Physical                            was performed, and patient medications and                            allergies were reviewed. The patient's tolerance of                            previous anesthesia was also reviewed. The risks                            and benefits of the procedure and the sedation                            options and risks were discussed with the patient.                            All questions were answered, and informed consent                            was obtained. Prior Anticoagulants: The patient has                            taken no previous anticoagulant or antiplatelet                            agents. ASA Grade Assessment: II - A patient with                            mild systemic disease. After reviewing the risks                            and benefits, the patient was deemed in                            satisfactory condition to undergo the procedure.                           After obtaining informed consent, the colonoscope  was passed under direct vision. Throughout the                            procedure, the patient's blood pressure, pulse, and                            oxygen saturations were monitored continuously. The                            Colonoscope was introduced through the anus and                            advanced to the the terminal ileum. The colonoscopy                            was performed without difficulty. The patient                            tolerated the procedure well. The quality of the                            bowel preparation was good. The terminal ileum, the                            appendiceal orifice and the rectum were                            photographed. Scope In: 3:13:53 PM Scope Out: 3:24:04 PM Scope Withdrawal Time: 0 hours 7 minutes 16 seconds  Total Procedure Duration: 0 hours 10 minutes 11 seconds  Findings:                 The terminal ileum appeared normal.                           External and internal hemorrhoids were found. The                            hemorrhoids were small.                           The exam was otherwise without abnormality on                            direct and retroflexion views. Complications:            No immediate complications. Estimated Blood Loss:     Estimated blood loss: none. Impression:               - The examined portion of the ileum was normal.                           - External and internal hemorrhoids.                           - The examination was otherwise normal on direct  and retroflexion views.                           - No polyps or cancers. Recommendation:           - Patient has a contact number available for                            emergencies. The signs and symptoms of potential                            delayed complications were discussed with the                            patient. Return to normal activities tomorrow.                            Written discharge instructions were provided to the                            patient.                           - Resume previous diet.                           - Continue present medications.                           - Repeat colonoscopy in 10 years for screening.                           - Will consider referral for in-office hemorrhoidal                            banding. Milus Banister, MD 07/13/2019 3:27:37 PM This report has  been signed electronically.

## 2019-07-13 NOTE — Patient Instructions (Signed)
   INFORMATION ON HEMORRHOIDS GIVEN TO YOU TODAY  HANDOUT ON HEMORRHOIDAL BANDING GIVEN TO YOU TODAY   YOU HAD AN ENDOSCOPIC PROCEDURE TODAY AT [facility]:   Refer to the procedure report that was given to you for any specific questions about what was found during the examination.  If the procedure report does not answer your questions, please call your gastroenterologist to clarify.  If you requested that your care partner not be given the details of your procedure findings, then the procedure report has been included in a sealed envelope for you to review at your convenience later.  YOU SHOULD EXPECT: Some feelings of bloating in the abdomen. Passage of more gas than usual.  Walking can help get rid of the air that was put into your GI tract during the procedure and reduce the bloating. If you had a lower endoscopy (such as a colonoscopy or flexible sigmoidoscopy) you may notice spotting of blood in your stool or on the toilet paper. If you underwent a bowel prep for your procedure, you may not have a normal bowel movement for a few days.  Please Note:  You might notice some irritation and congestion in your nose or some drainage.  This is from the oxygen used during your procedure.  There is no need for concern and it should clear up in a day or so.  SYMPTOMS TO REPORT IMMEDIATELY:   Following lower endoscopy (colonoscopy or flexible sigmoidoscopy):  Excessive amounts of blood in the stool  Significant tenderness or worsening of abdominal pains  Swelling of the abdomen that is new, acute  Fever of 100F or higher    For urgent or emergent issues, a gastroenterologist can be reached at any hour by calling [phone].   DIET:  We do recommend a small meal at first, but then you may proceed to your regular diet.  Drink plenty of fluids but you should avoid alcoholic beverages for 24 hours.  ACTIVITY:  You should plan to take it easy for the rest of today and you  should NOT DRIVE or use heavy machinery until tomorrow (because of the sedation medicines used during the test).    FOLLOW UP: Our staff will call the number listed on your records 48-72 hours following your procedure to check on you and address any questions or concerns that you may have regarding the information given to you following your procedure. If we do not reach you, we will leave a message.  We will attempt to reach you two times.  During this call, we will ask if you have developed any symptoms of COVID 19. If you develop any symptoms (ie: fever, flu-like symptoms, shortness of breath, cough etc.) before then, please call [phone].  If you test positive for Covid 19 in the 2 weeks post procedure, please call and report this information to [place].    If any biopsies were taken you will be contacted by phone or by letter within the next 1-3 weeks.  Please call us at [phone] if you have not heard about the biopsies in 3 weeks.    SIGNATURES/CONFIDENTIALITY: You and/or your care partner have signed paperwork which will be entered into your electronic medical record.  These signatures attest to the fact that that the information above on your After Visit Summary has been reviewed and is understood.  Full responsibility of the confidentiality of this discharge information lies with you and/or your care-partner.

## 2019-07-13 NOTE — Progress Notes (Signed)
Report to PACU, RN, vss, BBS= Clear.  

## 2019-07-15 ENCOUNTER — Telehealth: Payer: Self-pay

## 2019-07-15 ENCOUNTER — Telehealth: Payer: Self-pay | Admitting: *Deleted

## 2019-07-15 NOTE — Telephone Encounter (Signed)
  Follow up Call-  Call back number 07/13/2019  Post procedure Call Back phone  # [phone]  Permission to leave phone message Yes  Some recent data might be hidden     Patient questions:  Message left to call us if necessary.

## 2019-07-15 NOTE — Telephone Encounter (Signed)
LVM

## 2019-09-06 ENCOUNTER — Encounter (HOSPITAL_COMMUNITY): Payer: Self-pay | Admitting: Emergency Medicine

## 2019-09-06 ENCOUNTER — Other Ambulatory Visit: Payer: Self-pay

## 2019-09-06 ENCOUNTER — Emergency Department (HOSPITAL_COMMUNITY): Payer: Self-pay

## 2019-09-06 ENCOUNTER — Inpatient Hospital Stay (HOSPITAL_COMMUNITY)
Admission: EM | Admit: 2019-09-06 | Discharge: 2019-09-10 | DRG: 964 | Disposition: A | Discharge status: Home / Self Care | Payer: Self-pay | Attending: Surgery | Admitting: Surgery

## 2019-09-06 DIAGNOSIS — W3400XA Accidental discharge from unspecified firearms or gun, initial encounter: Secondary | ICD-10-CM

## 2019-09-06 DIAGNOSIS — R042 Hemoptysis: Secondary | ICD-10-CM | POA: Diagnosis not present

## 2019-09-06 DIAGNOSIS — K219 Gastro-esophageal reflux disease without esophagitis: Secondary | ICD-10-CM | POA: Diagnosis present

## 2019-09-06 DIAGNOSIS — Z20828 Contact with and (suspected) exposure to other viral communicable diseases: Secondary | ICD-10-CM | POA: Diagnosis present

## 2019-09-06 DIAGNOSIS — E872 Acidosis: Secondary | ICD-10-CM | POA: Diagnosis present

## 2019-09-06 DIAGNOSIS — K567 Ileus, unspecified: Secondary | ICD-10-CM | POA: Diagnosis not present

## 2019-09-06 DIAGNOSIS — S27321A Contusion of lung, unilateral, initial encounter: Secondary | ICD-10-CM | POA: Diagnosis present

## 2019-09-06 DIAGNOSIS — S21131A Puncture wound without foreign body of right front wall of thorax without penetration into thoracic cavity, initial encounter: Principal | ICD-10-CM | POA: Diagnosis present

## 2019-09-06 DIAGNOSIS — S2231XA Fracture of one rib, right side, initial encounter for closed fracture: Secondary | ICD-10-CM | POA: Diagnosis present

## 2019-09-06 DIAGNOSIS — D72829 Elevated white blood cell count, unspecified: Secondary | ICD-10-CM | POA: Diagnosis not present

## 2019-09-06 DIAGNOSIS — S36113A Laceration of liver, unspecified degree, initial encounter: Secondary | ICD-10-CM | POA: Diagnosis present

## 2019-09-06 LAB — CBC WITH DIFFERENTIAL/PLATELET
Abs Immature Granulocytes: 0 10*3/uL (ref 0.00–0.07)
Basophils Absolute: 0.3 10*3/uL — ABNORMAL HIGH (ref 0.0–0.1)
Basophils Relative: 2 %
Eosinophils Absolute: 0 10*3/uL (ref 0.0–0.5)
Eosinophils Relative: 0 %
HCT: 43.4 % (ref 39.0–52.0)
Hemoglobin: 14.1 g/dL (ref 13.0–17.0)
Lymphocytes Relative: 35 %
Lymphs Abs: 4.6 10*3/uL — ABNORMAL HIGH (ref 0.7–4.0)
MCH: 31 pg (ref 26.0–34.0)
MCHC: 32.5 g/dL (ref 30.0–36.0)
MCV: 95.4 fL (ref 80.0–100.0)
Monocytes Absolute: 1.4 10*3/uL — ABNORMAL HIGH (ref 0.1–1.0)
Monocytes Relative: 11 %
Neutro Abs: 6.8 10*3/uL (ref 1.7–7.7)
Neutrophils Relative %: 52 %
Platelets: 429 10*3/uL — ABNORMAL HIGH (ref 150–400)
RBC: 4.55 MIL/uL (ref 4.22–5.81)
RDW: 13.7 % (ref 11.5–15.5)
WBC: 13 10*3/uL — ABNORMAL HIGH (ref 4.0–10.5)
nRBC: 0 % (ref 0.0–0.2)
nRBC: 0 /100 WBC

## 2019-09-06 LAB — COMPREHENSIVE METABOLIC PANEL
ALT: 102 U/L — ABNORMAL HIGH (ref 0–44)
AST: 67 U/L — ABNORMAL HIGH (ref 15–41)
Albumin: 3.8 g/dL (ref 3.5–5.0)
Alkaline Phosphatase: 71 U/L (ref 38–126)
Anion gap: 11 (ref 5–15)
BUN: 13 mg/dL (ref 6–20)
CO2: 20 mmol/L — ABNORMAL LOW (ref 22–32)
Calcium: 8.8 mg/dL — ABNORMAL LOW (ref 8.9–10.3)
Chloride: 105 mmol/L (ref 98–111)
Creatinine, Ser: 1.07 mg/dL (ref 0.61–1.24)
GFR calc Af Amer: >60 mL/min (ref >60)
GFR calc non Af Amer: >60 mL/min (ref >60)
Glucose, Bld: 197 mg/dL — ABNORMAL HIGH (ref 70–99)
Potassium: 2.9 mmol/L — ABNORMAL LOW (ref 3.5–5.1)
Sodium: 136 mmol/L (ref 135–145)
Total Bilirubin: 0.4 mg/dL (ref 0.3–1.2)
Total Protein: 6.9 g/dL (ref 6.5–8.1)

## 2019-09-06 LAB — TYPE AND SCREEN
ABO/RH(D): O POS
Antibody Screen: NEGATIVE

## 2019-09-06 LAB — ABO/RH: ABO/RH(D): O POS

## 2019-09-06 LAB — SAMPLE TO BLOOD BANK

## 2019-09-06 MED ORDER — TETANUS-DIPHTH-ACELL PERTUSSIS 5-2.5-18.5 LF-MCG/0.5 IM SUSP
0.5000 mL | Freq: Once | INTRAMUSCULAR | Status: AC
  Administered 2019-09-06: 0.5 mL via INTRAMUSCULAR
  Filled 2019-09-06: qty 0.5

## 2019-09-06 MED ORDER — HYDROMORPHONE HCL 1 MG/ML IJ SOLN
1.0000 mg | INTRAMUSCULAR | Status: DC | PRN
  Administered 2019-09-07 – 2019-09-09 (×4): 1 mg via INTRAVENOUS
  Filled 2019-09-06 (×4): qty 1

## 2019-09-06 MED ORDER — OXYCODONE HCL 5 MG PO TABS
5.0000 mg | ORAL_TABLET | ORAL | Status: DC | PRN
  Filled 2019-09-06: qty 1

## 2019-09-06 MED ORDER — ACETAMINOPHEN 325 MG PO TABS: 650.0000 mg | ORAL_TABLET | ORAL | Status: DC | PRN

## 2019-09-06 MED ORDER — POTASSIUM CHLORIDE IN NACL 20-0.9 MEQ/L-% IV SOLN
INTRAVENOUS | Status: DC
  Administered 2019-09-07 (×2): via INTRAVENOUS
  Filled 2019-09-06 (×2): qty 1000

## 2019-09-06 MED ORDER — METOPROLOL TARTRATE 5 MG/5ML IV SOLN
5.0000 mg | Freq: Four times a day (QID) | INTRAVENOUS | Status: DC | PRN
  Filled 2019-09-06: qty 5

## 2019-09-06 MED ORDER — HYDROCODONE-ACETAMINOPHEN 5-325 MG PO TABS
1.0000 | ORAL_TABLET | Freq: Once | ORAL | Status: DC
  Filled 2019-09-06: qty 1

## 2019-09-06 MED ORDER — IOHEXOL 300 MG/ML  SOLN
100.0000 mL | Freq: Once | INTRAMUSCULAR | Status: AC | PRN
  Administered 2019-09-06: 100 mL via INTRAVENOUS

## 2019-09-06 MED ORDER — OXYCODONE HCL 5 MG PO TABS
10.0000 mg | ORAL_TABLET | ORAL | Status: DC | PRN
  Administered 2019-09-07 – 2019-09-10 (×9): 10 mg via ORAL
  Filled 2019-09-06 (×9): qty 2

## 2019-09-06 MED ORDER — ONDANSETRON HCL 4 MG/2ML IJ SOLN
4.0000 mg | Freq: Four times a day (QID) | INTRAMUSCULAR | Status: DC | PRN
  Administered 2019-09-07 (×2): 4 mg via INTRAVENOUS
  Filled 2019-09-06 (×2): qty 2

## 2019-09-06 MED ORDER — ONDANSETRON 4 MG PO TBDP
4.0000 mg | ORAL_TABLET | Freq: Four times a day (QID) | ORAL | Status: DC | PRN
  Administered 2019-09-08 (×2): 4 mg via ORAL
  Filled 2019-09-06 (×2): qty 1
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
## 2019-09-07 ENCOUNTER — Encounter (HOSPITAL_COMMUNITY): Payer: Self-pay

## 2019-09-07 LAB — BASIC METABOLIC PANEL
Anion gap: 23 — ABNORMAL HIGH (ref 5–15)
Anion gap: 16 — ABNORMAL HIGH (ref 5–15)
BUN: 12 mg/dL (ref 6–20)
BUN: 10 mg/dL (ref 6–20)
CO2: 14 mmol/L — ABNORMAL LOW (ref 22–32)
CO2: 21 mmol/L — ABNORMAL LOW (ref 22–32)
Calcium: 9.3 mg/dL (ref 8.9–10.3)
Calcium: 9.2 mg/dL (ref 8.9–10.3)
Chloride: 103 mmol/L (ref 98–111)
Chloride: 103 mmol/L (ref 98–111)
Creatinine, Ser: 0.9 mg/dL (ref 0.61–1.24)
Creatinine, Ser: 0.86 mg/dL (ref 0.61–1.24)
GFR calc Af Amer: >60 mL/min (ref >60)
GFR calc Af Amer: >60 mL/min (ref >60)
GFR calc non Af Amer: >60 mL/min (ref >60)
GFR calc non Af Amer: >60 mL/min (ref >60)
Glucose, Bld: 150 mg/dL — ABNORMAL HIGH (ref 70–99)
Glucose, Bld: 114 mg/dL — ABNORMAL HIGH (ref 70–99)
Potassium: 4.2 mmol/L (ref 3.5–5.1)
Potassium: 3.5 mmol/L (ref 3.5–5.1)
Sodium: 140 mmol/L (ref 135–145)
Sodium: 140 mmol/L (ref 135–145)

## 2019-09-07 LAB — CBC
HCT: 40.5 % (ref 39.0–52.0)
HCT: 40.7 % (ref 39.0–52.0)
Hemoglobin: 13.7 g/dL (ref 13.0–17.0)
Hemoglobin: 14.2 g/dL (ref 13.0–17.0)
MCH: 30.9 pg (ref 26.0–34.0)
MCH: 31.6 pg (ref 26.0–34.0)
MCHC: 33.8 g/dL (ref 30.0–36.0)
MCHC: 34.9 g/dL (ref 30.0–36.0)
MCV: 91.4 fL (ref 80.0–100.0)
MCV: 90.6 fL (ref 80.0–100.0)
Platelets: 335 10*3/uL (ref 150–400)
Platelets: 302 10*3/uL (ref 150–400)
RBC: 4.43 MIL/uL (ref 4.22–5.81)
RBC: 4.49 MIL/uL (ref 4.22–5.81)
RDW: 13.6 % (ref 11.5–15.5)
RDW: 13.7 % (ref 11.5–15.5)
WBC: 15.6 10*3/uL — ABNORMAL HIGH (ref 4.0–10.5)
WBC: 13.3 10*3/uL — ABNORMAL HIGH (ref 4.0–10.5)
nRBC: 0 % (ref 0.0–0.2)
nRBC: 0 % (ref 0.0–0.2)

## 2019-09-07 LAB — HIV ANTIBODY (ROUTINE TESTING W REFLEX): HIV Screen 4th Generation wRfx: NONREACTIVE

## 2019-09-07 LAB — MRSA PCR SCREENING: MRSA by PCR: NEGATIVE

## 2019-09-07 LAB — SARS CORONAVIRUS 2 (TAT 6-24 HRS): SARS Coronavirus 2: NEGATIVE

## 2019-09-07 MED ORDER — SODIUM CHLORIDE 0.9 % IV BOLUS
500.0000 mL | Freq: Once | INTRAVENOUS | Status: AC
  Administered 2019-09-07: 15:00:00 via INTRAVENOUS

## 2019-09-07 MED ORDER — METOPROLOL TARTRATE 5 MG/5ML IV SOLN
5.0000 mg | Freq: Four times a day (QID) | INTRAVENOUS | Status: DC | PRN
  Administered 2019-09-07: 5 mg via INTRAVENOUS
  Filled 2019-09-07: qty 5

## 2019-09-07 MED ORDER — DOCUSATE SODIUM 100 MG PO CAPS
100.0000 mg | ORAL_CAPSULE | Freq: Two times a day (BID) | ORAL | Status: DC
  Administered 2019-09-07 – 2019-09-10 (×7): 100 mg via ORAL
  Filled 2019-09-07 (×7): qty 1

## 2019-09-07 MED ORDER — POLYETHYLENE GLYCOL 3350 17 G PO PACK
17.0000 g | PACK | Freq: Every day | ORAL | Status: DC
  Administered 2019-09-07 – 2019-09-10 (×4): 17 g via ORAL
  Filled 2019-09-07 (×4): qty 1
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
## 2019-09-08 LAB — CBC
HCT: 38.9 % — ABNORMAL LOW (ref 39.0–52.0)
Hemoglobin: 12.9 g/dL — ABNORMAL LOW (ref 13.0–17.0)
MCH: 30.9 pg (ref 26.0–34.0)
MCHC: 33.2 g/dL (ref 30.0–36.0)
MCV: 93.1 fL (ref 80.0–100.0)
Platelets: 302 10*3/uL (ref 150–400)
RBC: 4.18 MIL/uL — ABNORMAL LOW (ref 4.22–5.81)
RDW: 13.8 % (ref 11.5–15.5)
WBC: 11.3 10*3/uL — ABNORMAL HIGH (ref 4.0–10.5)
nRBC: 0 % (ref 0.0–0.2)

## 2019-09-08 LAB — BASIC METABOLIC PANEL
Anion gap: 12 (ref 5–15)
BUN: 9 mg/dL (ref 6–20)
CO2: 24 mmol/L (ref 22–32)
Calcium: 8.9 mg/dL (ref 8.9–10.3)
Chloride: 104 mmol/L (ref 98–111)
Creatinine, Ser: 0.74 mg/dL (ref 0.61–1.24)
GFR calc Af Amer: >60 mL/min (ref >60)
GFR calc non Af Amer: >60 mL/min (ref >60)
Glucose, Bld: 105 mg/dL — ABNORMAL HIGH (ref 70–99)
Potassium: 4 mmol/L (ref 3.5–5.1)
Sodium: 140 mmol/L (ref 135–145)

## 2019-09-08 MED ORDER — PANTOPRAZOLE SODIUM 40 MG PO TBEC
40.0000 mg | DELAYED_RELEASE_TABLET | Freq: Every day | ORAL | Status: DC
  Administered 2019-09-09 – 2019-09-10 (×2): 40 mg via ORAL
  Filled 2019-09-08 (×2): qty 1

## 2019-09-08 MED ORDER — ALUM & MAG HYDROXIDE-SIMETH 200-200-20 MG/5ML PO SUSP: 15.0000 mL | Freq: Four times a day (QID) | ORAL | Status: DC | PRN

## 2019-09-08 MED ORDER — BACITRACIN ZINC 500 UNIT/GM EX OINT
TOPICAL_OINTMENT | Freq: Two times a day (BID) | CUTANEOUS | Status: DC
  Administered 2019-09-08 – 2019-09-09 (×4): via TOPICAL
  Filled 2019-09-08: qty 28.4

## 2019-09-08 MED ORDER — BISACODYL 10 MG RE SUPP: 10.0000 mg | Freq: Every day | RECTAL | Status: DC | PRN

## 2019-09-08 MED ORDER — GUAIFENESIN ER 600 MG PO TB12
600.0000 mg | ORAL_TABLET | Freq: Two times a day (BID) | ORAL | Status: DC
  Administered 2019-09-08 – 2019-09-10 (×5): 600 mg via ORAL
  Filled 2019-09-08 (×5): qty 1

## 2019-09-08 MED ORDER — ACETAMINOPHEN 325 MG PO TABS
650.0000 mg | ORAL_TABLET | Freq: Four times a day (QID) | ORAL | Status: DC
  Administered 2019-09-08 – 2019-09-10 (×9): 650 mg via ORAL
  Filled 2019-09-08 (×9): qty 2

## 2019-09-08 MED ORDER — METHOCARBAMOL 500 MG PO TABS
500.0000 mg | ORAL_TABLET | Freq: Three times a day (TID) | ORAL | Status: DC
  Administered 2019-09-08 – 2019-09-10 (×7): 500 mg via ORAL
  Filled 2019-09-08 (×7): qty 1
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
## 2019-09-09 ENCOUNTER — Inpatient Hospital Stay (HOSPITAL_COMMUNITY): Payer: Self-pay

## 2019-09-09 LAB — CBC
HCT: 38.8 % — ABNORMAL LOW (ref 39.0–52.0)
Hemoglobin: 13.1 g/dL (ref 13.0–17.0)
MCH: 31.1 pg (ref 26.0–34.0)
MCHC: 33.8 g/dL (ref 30.0–36.0)
MCV: 92.2 fL (ref 80.0–100.0)
Platelets: 291 10*3/uL (ref 150–400)
RBC: 4.21 MIL/uL — ABNORMAL LOW (ref 4.22–5.81)
RDW: 13.5 % (ref 11.5–15.5)
WBC: 13.2 10*3/uL — ABNORMAL HIGH (ref 4.0–10.5)
nRBC: 0 % (ref 0.0–0.2)

## 2019-09-09 LAB — BASIC METABOLIC PANEL
Anion gap: 14 (ref 5–15)
BUN: 10 mg/dL (ref 6–20)
CO2: 22 mmol/L (ref 22–32)
Calcium: 9 mg/dL (ref 8.9–10.3)
Chloride: 103 mmol/L (ref 98–111)
Creatinine, Ser: 0.83 mg/dL (ref 0.61–1.24)
GFR calc Af Amer: >60 mL/min (ref >60)
GFR calc non Af Amer: >60 mL/min (ref >60)
Glucose, Bld: 112 mg/dL — ABNORMAL HIGH (ref 70–99)
Potassium: 3.8 mmol/L (ref 3.5–5.1)
Sodium: 139 mmol/L (ref 135–145)

## 2019-09-09 MED ORDER — BISACODYL 10 MG RE SUPP
10.0000 mg | Freq: Once | RECTAL | Status: AC
  Administered 2019-09-09: 10 mg via RECTAL
  Filled 2019-09-09: qty 1

## 2019-09-09 MED ORDER — HYDROMORPHONE HCL 1 MG/ML IJ SOLN: 0.5000 mg | INTRAMUSCULAR | Status: DC | PRN
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
## 2019-09-10 MED ORDER — DOCUSATE SODIUM 100 MG PO CAPS: 100.0000 mg | ORAL_CAPSULE | Freq: Two times a day (BID) | ORAL | Status: DC

## 2019-09-10 MED ORDER — ACETAMINOPHEN 325 MG PO TABS: 650.0000 mg | ORAL_TABLET | Freq: Four times a day (QID) | ORAL | Status: DC | PRN

## 2019-09-10 MED ORDER — GUAIFENESIN ER 600 MG PO TB12: 600.0000 mg | ORAL_TABLET | Freq: Two times a day (BID) | ORAL | Status: DC | PRN

## 2019-09-10 MED ORDER — POLYETHYLENE GLYCOL 3350 17 G PO PACK: 17.0000 g | PACK | Freq: Every day | ORAL | Status: DC | PRN

## 2019-09-10 MED ORDER — MAGNESIUM HYDROXIDE 400 MG/5ML PO SUSP: 15.0000 mL | Freq: Once | ORAL | Status: DC

## 2019-09-10 MED ORDER — OXYCODONE HCL 10 MG PO TABS: 5.0000 mg | ORAL_TABLET | ORAL | 0 refills | Status: DC | PRN

## 2019-09-10 MED ORDER — METHOCARBAMOL 500 MG PO TABS: 500.0000 mg | ORAL_TABLET | Freq: Three times a day (TID) | ORAL | 0 refills | Status: DC
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
## 2019-09-13 ENCOUNTER — Encounter: Payer: Self-pay | Admitting: Gastroenterology
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
## 2020-11-20 ENCOUNTER — Other Ambulatory Visit: Payer: Self-pay

## 2020-11-20 ENCOUNTER — Ambulatory Visit
Admission: EM | Admit: 2020-11-20 | Discharge: 2020-11-20 | Disposition: A | Discharge status: Home / Self Care | Payer: Self-pay | Attending: Physician Assistant | Admitting: Physician Assistant

## 2020-11-20 ENCOUNTER — Encounter: Payer: Self-pay | Admitting: Emergency Medicine

## 2020-11-20 DIAGNOSIS — L509 Urticaria, unspecified: Secondary | ICD-10-CM

## 2020-11-20 MED ORDER — METHYLPREDNISOLONE SODIUM SUCC 125 MG IJ SOLR
125.0000 mg | Freq: Once | INTRAMUSCULAR | Status: AC
  Administered 2020-11-20: 125 mg via INTRAMUSCULAR
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
## 2020-11-20 NOTE — ED Triage Notes (Signed)
Pt states that Sunday morning he woke up and realized that he has a rash all over his body. Pt states that he feels pain in both hands and his body itches all over. Pt states that he did take benadryl yesterday and it helped with the itching.

## 2020-11-20 NOTE — Discharge Instructions (Signed)
Benadryl 25 mg every 4 hours.

## 2020-11-30 ENCOUNTER — Ambulatory Visit
Admission: EM | Admit: 2020-11-30 | Discharge: 2020-11-30 | Disposition: A | Discharge status: Home / Self Care | Payer: Self-pay | Attending: Emergency Medicine | Admitting: Emergency Medicine

## 2020-11-30 ENCOUNTER — Other Ambulatory Visit: Payer: Self-pay

## 2020-11-30 DIAGNOSIS — K047 Periapical abscess without sinus: Secondary | ICD-10-CM

## 2020-11-30 DIAGNOSIS — R220 Localized swelling, mass and lump, head: Secondary | ICD-10-CM

## 2020-11-30 MED ORDER — AMOXICILLIN-POT CLAVULANATE 875-125 MG PO TABS: 1.0000 | ORAL_TABLET | Freq: Two times a day (BID) | ORAL | 0 refills | Status: AC

## 2020-11-30 MED ORDER — IBUPROFEN 800 MG PO TABS: 800.0000 mg | ORAL_TABLET | Freq: Three times a day (TID) | ORAL | 0 refills | Status: DC

## 2020-11-30 MED ORDER — CEFTRIAXONE SODIUM 1 G IJ SOLR
1.0000 g | Freq: Once | INTRAMUSCULAR | Status: AC
  Administered 2020-11-30: 1 g via INTRAMUSCULAR

## 2020-11-30 NOTE — ED Triage Notes (Signed)
Patient presents to Urgent Care with complaints of dental pain to upper molar  and headache since yesterday. He has noted swelling to left jaw area. Treating pain with tylenol (1455), ibuprofen, and oral gel. He has also been doing salt water rinses.   Denies fever.

## 2020-11-30 NOTE — Discharge Instructions (Addendum)
We gave you a shot of Rocephin Continue with Augmentin twice daily for the next week Tylenol and ibuprofen for pain and swelling Warm compresses to face to help with swelling Please continue to monitor, if not seeing any improvement or symptoms worsening over the next 48 hours please follow-up in emergency room
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
## 2023-10-07 ENCOUNTER — Ambulatory Visit: Payer: Self-pay

## 2023-10-27 ENCOUNTER — Emergency Department (HOSPITAL_COMMUNITY)
Admission: EM | Admit: 2023-10-27 | Discharge: 2023-10-27 | Disposition: A | Discharge status: Home / Self Care | Payer: Self-pay | Attending: Emergency Medicine | Admitting: Emergency Medicine

## 2023-10-27 ENCOUNTER — Emergency Department (HOSPITAL_COMMUNITY): Payer: Self-pay

## 2023-10-27 ENCOUNTER — Encounter (HOSPITAL_COMMUNITY): Payer: Self-pay

## 2023-10-27 ENCOUNTER — Ambulatory Visit
Admission: RE | Admit: 2023-10-27 | Discharge: 2023-10-27 | Disposition: A | Discharge status: Home / Self Care | Payer: Self-pay | Source: Ambulatory Visit | Attending: Internal Medicine | Admitting: Internal Medicine

## 2023-10-27 ENCOUNTER — Other Ambulatory Visit: Payer: Self-pay

## 2023-10-27 VITALS — BP 177/106 | HR 72 | Temp 97.5°F | Resp 18

## 2023-10-27 DIAGNOSIS — R079 Chest pain, unspecified: Secondary | ICD-10-CM

## 2023-10-27 DIAGNOSIS — I10 Essential (primary) hypertension: Secondary | ICD-10-CM

## 2023-10-27 DIAGNOSIS — J45909 Unspecified asthma, uncomplicated: Secondary | ICD-10-CM | POA: Insufficient documentation

## 2023-10-27 DIAGNOSIS — M940 Chondrocostal junction syndrome [Tietze]: Secondary | ICD-10-CM

## 2023-10-27 DIAGNOSIS — G44201 Tension-type headache, unspecified, intractable: Secondary | ICD-10-CM

## 2023-10-27 DIAGNOSIS — R42 Dizziness and giddiness: Secondary | ICD-10-CM

## 2023-10-27 LAB — BASIC METABOLIC PANEL
Anion gap: 9 (ref 5–15)
BUN: 11 mg/dL (ref 6–20)
CO2: 24 mmol/L (ref 22–32)
Calcium: 9.4 mg/dL (ref 8.9–10.3)
Chloride: 104 mmol/L (ref 98–111)
Creatinine, Ser: 0.97 mg/dL (ref 0.61–1.24)
GFR, Estimated: >60 mL/min (ref >60)
Glucose, Bld: 113 mg/dL — ABNORMAL HIGH (ref 70–99)
Potassium: 4.2 mmol/L (ref 3.5–5.1)
Sodium: 137 mmol/L (ref 135–145)

## 2023-10-27 LAB — CBC
HCT: 48 % (ref 39.0–52.0)
Hemoglobin: 16.2 g/dL (ref 13.0–17.0)
MCH: 30.4 pg (ref 26.0–34.0)
MCHC: 33.8 g/dL (ref 30.0–36.0)
MCV: 90.1 fL (ref 80.0–100.0)
Platelets: 412 10*3/uL — ABNORMAL HIGH (ref 150–400)
RBC: 5.33 MIL/uL (ref 4.22–5.81)
RDW: 12.6 % (ref 11.5–15.5)
WBC: 8.7 10*3/uL (ref 4.0–10.5)
nRBC: 0 % (ref 0.0–0.2)

## 2023-10-27 LAB — TROPONIN I (HIGH SENSITIVITY)
Troponin I (High Sensitivity): 4 ng/L (ref <18)
Troponin I (High Sensitivity): 4 ng/L (ref <18)

## 2023-10-27 MED ORDER — KETOROLAC TROMETHAMINE 15 MG/ML IJ SOLN
15.0000 mg | Freq: Once | INTRAMUSCULAR | Status: AC
  Administered 2023-10-27: 15 mg via INTRAMUSCULAR
  Filled 2023-10-27: qty 1

## 2023-10-27 NOTE — ED Triage Notes (Addendum)
Pt reports intermittent dizziness episodes x2 weeks. Also notes heart palpitations, blurred vision, tingling lips, and headaches in the past two weeks. Reports having symptoms like this multiple times over the last year. Pt states he is still a little dizzy right now. Earlier today had sharp pain to L chest and mild headache. Hx of hypertension. Pt has been off BP meds by choice for 10 years and has not followed up with any medical doctors in over 9 yrs.

## 2023-10-27 NOTE — Discharge Instructions (Signed)
Go to the ER right now to have further work up done
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
## 2023-10-27 NOTE — ED Triage Notes (Addendum)
Pt c/o dizziness, headache, and lips tingling for past few weeks. Pt states first episode happened 9 months ago but has had more frequent episodes recently. Pt states he has intermittent chest pain with same. Pt has a hx of high blood pressure, does not take medicine.

## 2023-10-27 NOTE — ED Provider Notes (Signed)
[place] EMERGENCY DEPARTMENT AT [facility] Provider Note   CSN: [id number] Arrival date & time: 10/27/23  1249     History  Chief Complaint  Patient presents with   Dizziness    SUN AFIFI is a 53 y.o. male with PMHx anxiety, asthma, HTN who presents to ED concerned for intermittent episodes of dizziness, blurred vision, palpitations, nausea, clammy hands, chest pain that has been happening over the past 8-9 months but has been progressively worsening/becoming more frequent over the past 2 weeks. Patient stating that he currently has a mild frontal headache and dizziness - but then states that this symptom is not necessarily dizziness but just a weird felling in his head. Patient stating that his symptoms are causing him anxiety and he is worried that cancer may be causing his symptoms. States that his most recent episode happened while watching TV today.  Denies fever, dyspnea, vomiting, diarrhea, dysuria, hematuria, hematochezia. Denies rhinorrhea, congestion, sore throat. Denies recent surgery/immobilization, hx DVT/PE, hemoptysis, hx cancer in the past 6 months, calf swelling/tenderness.    Dizziness      Home Medications Prior to Admission medications   Medication Sig Start Date End Date Taking? Authorizing Provider  ibuprofen (ADVIL) 200 MG tablet Take 400 mg by mouth every 6 (six) hours as needed for moderate pain (pain score 4-6).   Yes [provider]      Allergies    Patient has no known allergies.    Review of Systems   Review of Systems  Neurological:  Positive for dizziness.    Physical Exam Updated Vital Signs BP (!) 158/93   Pulse 79   Temp 97.7 F (36.5 C)   Resp 17   Ht 5\' 10"  (1.778 m)   Wt 81.6 kg   SpO2 98%   BMI 25.83 kg/m  Physical Exam Vitals and nursing note reviewed.  Constitutional:      General: He is not in acute distress.    Appearance: He is not ill-appearing or toxic-appearing.  HENT:     Head:  Normocephalic and atraumatic.     Mouth/Throat:     Mouth: Mucous membranes are moist.  Eyes:     General: No scleral icterus.       Right eye: No discharge.        Left eye: No discharge.     Conjunctiva/sclera: Conjunctivae normal.  Cardiovascular:     Rate and Rhythm: Normal rate and regular rhythm.     Pulses: Normal pulses.     Heart sounds: Normal heart sounds. No murmur heard. Pulmonary:     Effort: Pulmonary effort is normal. No respiratory distress.     Breath sounds: Normal breath sounds. No wheezing, rhonchi or rales.  Abdominal:     General: Abdomen is flat. Bowel sounds are normal.     Palpations: Abdomen is soft.  Musculoskeletal:     Right lower leg: No edema.     Left lower leg: No edema.  Skin:    General: Skin is warm and dry.     Findings: No rash.  Neurological:     General: No focal deficit present.     Mental Status: He is alert and oriented to person, place, and time. Mental status is at baseline.     Comments: GCS 15. Speech is goal oriented. No deficits appreciated to CN III-XII; symmetric eyebrow raise, no facial drooping, tongue midline. Patient has equal grip strength bilaterally with 5/5 strength against resistance in all  major muscle groups bilaterally. Sensation to light touch intact. Patient moves extremities without ataxia.    Psychiatric:        Mood and Affect: Mood normal.     ED Results / Procedures / Treatments   Labs (all labs ordered are listed, but only abnormal results are displayed) Labs Reviewed  BASIC METABOLIC PANEL - Abnormal; Notable for the following components:      Result Value   Glucose, Bld 113 (*)    All other components within normal limits  CBC - Abnormal; Notable for the following components:   Platelets 412 (*)    All other components within normal limits  TROPONIN I (HIGH SENSITIVITY)  TROPONIN I (HIGH SENSITIVITY)    EKG None  Radiology CT Head Wo Contrast Result Date: 10/27/2023 CLINICAL DATA:  Headache  EXAM: CT HEAD WITHOUT CONTRAST TECHNIQUE: Contiguous axial images were obtained from the base of the skull through the vertex without intravenous contrast. RADIATION DOSE REDUCTION: This exam was performed according to the departmental dose-optimization program which includes automated exposure control, adjustment of the mA and/or kV according to patient size and/or use of iterative reconstruction technique. COMPARISON:  None Available. FINDINGS: Brain: No acute intracranial abnormality. Specifically, no hemorrhage, hydrocephalus, mass lesion, acute infarction, or significant intracranial injury. Vascular: No hyperdense vessel or unexpected calcification. Skull: No acute calvarial abnormality. Sinuses/Orbits: No acute findings Other: None IMPRESSION: No acute intracranial abnormality. Electronically Signed   By: Charlett Nose M.D.   On: 10/27/2023 17:18   DG Chest 2 View Result Date: 10/27/2023 CLINICAL DATA:  Chest pain.  Dizziness. EXAM: CHEST - 2 VIEW COMPARISON:  09/09/2019 FINDINGS: The heart size and mediastinal contours are within normal limits. Both lungs are clear. The visualized skeletal structures are unremarkable. IMPRESSION: No active cardiopulmonary disease. Electronically Signed   By: Danae Orleans M.D.   On: 10/27/2023 14:39    Procedures Procedures    Medications Ordered in ED Medications  ketorolac (TORADOL) 15 MG/ML injection 15 mg (15 mg Intramuscular Given 10/27/23 1439)    ED Course/ Medical Decision Making/ A&P                                 Medical Decision Making Amount and/or Complexity of Data Reviewed Labs: ordered. Radiology: ordered.  Risk Prescription drug management.   This patient presents to the ED for concern of chest pain, dizziness, headache, this involves an extensive number of treatment options, and is a complaint that carries with it a high risk of complications and morbidity.  The differential diagnosis includes acute coronary syndrome, congestive  heart failure, pericarditis, pneumonia, pulmonary embolism, tension pneumothorax, esophageal rupture, aortic dissection, cardiac tamponade, musculoskeletal, ICH/SAH   Co morbidities that complicate the patient evaluation  anxiety, asthma, HTN   Additional history obtained:  Additional history obtained from 10/27/2023 UC note. Patient seen in UC and referred to ED. 2015 PCP notes: patient has tried lisinopril for HTN but did not tolerate side effects well - has not been on antihypertensives since.   Risk Stratification Score:  - HEART Score: 2 (dt HTN and age) - low risk - Wells PE Score: 0   Problem List / ED Course / Critical interventions / Medication management  Patient presented for intermittent episodes of dizziness, blurred vision, palpitations, nausea, clammy hands, chest pain that has been progressively worsening over the past 2 weeks. Physical/neuro exam unremarkable. Patient initially with HTN near 180/115 which resolved  without medical management to 158/93. Patient with hx of HTN but refused HTN medications in the past d/t side effects of making patient dizzy. Patient afebrile with stable vitals. I Ordered, and personally interpreted labs.  CBC without leukocytosis or anemia.  BMP unremarkable.  Initial and repeat troponin within normal limits The patient was maintained on a cardiac monitor.  I personally viewed and interpreted the EKG/cardiac monitored which showed an underlying rhythm of: Sinus rhythm. I ordered imaging studies including chest xray/CT head to assess for process contributing to patient's symptoms. I independently visualized and interpreted imaging which showed no acute process. I agree with the radiologist interpretation Patient eventually voicing concern that his symptoms might be related to cancer. Shared decision making with patient who would like to undergo CT head to further assess for possible cancer. I have ordered this to help ease patient's anxiety about  the possible cancer. This imaging was unremarkable. Shared lab/imaging results with patient. While I was sharing unremarkable results with patient - he stated that he started having a weird sensation in his head again which was not severe enough to consider dizziness. Talked to patient about anxiety - patient stating that he is not concerned for anxiety since his last episode at home happened while he was watching TV. I recommended following up with PCP. Patient stating that he already has an appointment scheduled for early March 2025 with a new PCP. Shared with patient that he will need to record BP to help his new PCP decide appropriate HTN management.  Patient verbalized understanding of plan. I have reviewed the patients home medicines and have made adjustments as needed Patient afebrile with stable vitals.  Provided with return precautions.  Discharged in good condition.  Ddx:  These are considered less likely due to history of present illness and physical exam findings.  -Acute coronary syndrome: EKG and troponins within normal limits  -Congestive heart failure: patient denies orthopnea, cough, and leg edema -Pneumonia: lungs are clear to auscultation bilaterally -Pulmonary embolism: no recent surgeries, blood clot hx, hemoptysis, cancer hx, vitals stable -Pneumothorax: lungs are clear to auscultation bilaterally -Esophageal rupture: patient denies vomiting, heavy drinking, and hx of GERD -Aortic dissection: vital signs are stable, no variation in pulse pressure -Cardiac tamponade: absence of hypotension, JVD, and muffled heart sounds -stroke/ICH/SAH: no neurodeficits   Social Determinants of Health:  none         Final Clinical Impression(s) / ED Diagnoses Final diagnoses:  Nonspecific chest pain  Intractable tension-type headache, unspecified chronicity pattern  Primary hypertension    Rx / DC Orders ED Discharge Orders     None         Dorthy Cooler,  PA-C 10/27/23 1820    Rexford Maus, [place] 10/28/23 [phone]

## 2023-10-27 NOTE — ED Notes (Addendum)
Patient is being discharged from the Urgent Care and sent to the Emergency Department via self . Per provider, patient is in need of higher level of care due to hypertension with dizziness and headache. Patient is aware and verbalizes understanding of plan of care.  Vitals:   10/27/23 1120  BP: (!) 177/106  Pulse: 72  Resp: 18  Temp: (!) 97.5 F (36.4 C)  SpO2: 96%

## 2023-10-27 NOTE — Discharge Instructions (Addendum)
 It was a pleasure caring for you today.  Workup today was reassuring.  Please follow-up with your primary care provider.  Seek emergency care if experiencing any new or worsening symptoms.  Alternating between 650 mg Tylenol and 400 mg Advil: The best way to alternate taking Acetaminophen (example Tylenol) and Ibuprofen (example Advil/Motrin) is to take them 3 hours apart. For example, if you take ibuprofen at 6 am you can then take Tylenol at 9 am. You can continue this regimen throughout the day, making sure you do not exceed the recommended maximum dose for each drug.

## 2023-12-08 ENCOUNTER — Ambulatory Visit: Payer: Self-pay | Admitting: Family Medicine

## 2023-12-08 NOTE — Progress Notes (Deleted)
 New Patient Office Visit  Subjective   Patient ID: Shawn Macias, male    DOB: 1971-09-11  Age: 53 y.o. MRN: [id number]  CC: No chief complaint on file.   HPI Shawn Macias presents to establish care ***  PMH: Anxiety asthma hypertension  PSH: ***  FH: ***  Tobacco use: *** Alcohol use: *** Drug use: *** Marital status: *** Employment: *** Sexual hx: ***  Screenings:  Colon Cancer: *** Lung Cancer: *** Breast Cancer: *** Diabetes: *** HLD: ***   Outpatient Encounter Medications as of 12/08/2023  Medication Sig   ibuprofen (ADVIL) 200 MG tablet Take 400 mg by mouth every 6 (six) hours as needed for moderate pain (pain score 4-6).   Facility-Administered Encounter Medications as of 12/08/2023  Medication   0.9 %  sodium chloride infusion    Past Medical History:  Diagnosis Date   Anxiety    Asthma    Hypertension     Past Surgical History:  Procedure Laterality Date   INNER EAR SURGERY Left 1984    Family History  Problem Relation Age of Onset   Diabetes Mother    Heart disease Mother    Hypertension Mother    Colon polyps Mother    Heart disease Brother    Hypertension Brother    Diabetes Maternal Grandmother    Heart disease Maternal Grandmother    Hypertension Maternal Grandmother    Crohn's disease Maternal Grandmother    Stroke Paternal Grandmother    Stroke Paternal Grandfather    Colon cancer Neg Hx    Rectal cancer Neg Hx     Social History   Socioeconomic History   Marital status: Single    Spouse name: Not on file   Number of children: Not on file   Years of education: Not on file   Highest education level: Not on file  Occupational History   Not on file  Tobacco Use   Smoking status: Never   Smokeless tobacco: Never  Vaping Use   Vaping status: Some Days   Substances: THC  Substance and Sexual Activity   Alcohol use: Yes   Drug use: Yes    Frequency: 10.0 times per week    Types: Marijuana   Sexual activity: Yes     Birth control/protection: None  Other Topics Concern   Not on file  Social History Narrative   ** Merged History Encounter **       Social Drivers of Health   Financial Resource Strain: Not on file  Food Insecurity: Not on file  Transportation Needs: Not on file  Physical Activity: Unknown (09/07/2019)   Exercise Vital Sign    Days of Exercise per Week: Patient declined    Minutes of Exercise per Session: Patient declined  Stress: Unknown (09/07/2019)   [employer] of Occupational Health - Occupational Stress Questionnaire    Feeling of Stress : Patient declined  Social Connections: Unknown (09/07/2019)   Social Connection and Isolation Panel [NHANES]    Frequency of Communication with Friends and Family: Patient declined    Frequency of Social Gatherings with Friends and Family: Patient declined    Attends Religious Services: Patient declined    Database administrator or Organizations: Patient declined    Attends Banker Meetings: Patient declined    Marital Status: Patient declined  Intimate Partner Violence: Unknown (09/07/2019)   Humiliation, Afraid, Rape, and Kick questionnaire    Fear of Current or Ex-Partner: Patient declined  Emotionally Abused: Patient declined    Physically Abused: Patient declined    Sexually Abused: Patient declined    ROS     Objective   There were no vitals taken for this visit.  Physical Exam     Assessment & Plan:   There are no diagnoses linked to this encounter.  No follow-ups on file.   Sandre Kitty, MD

## 2024-02-11 ENCOUNTER — Encounter: Payer: Self-pay | Admitting: Family Medicine

## 2024-02-11 ENCOUNTER — Ambulatory Visit (INDEPENDENT_AMBULATORY_CARE_PROVIDER_SITE_OTHER): Payer: Self-pay | Admitting: Family Medicine

## 2024-02-11 VITALS — BP 144/98 | HR 70 | Ht 70.0 in | Wt 177.9 lb

## 2024-02-11 DIAGNOSIS — R202 Paresthesia of skin: Secondary | ICD-10-CM

## 2024-02-11 DIAGNOSIS — Y249XXA Unspecified firearm discharge, undetermined intent, initial encounter: Secondary | ICD-10-CM

## 2024-02-11 DIAGNOSIS — Z7689 Persons encountering health services in other specified circumstances: Secondary | ICD-10-CM

## 2024-02-11 DIAGNOSIS — W3400XA Accidental discharge from unspecified firearms or gun, initial encounter: Secondary | ICD-10-CM

## 2024-02-11 DIAGNOSIS — Z125 Encounter for screening for malignant neoplasm of prostate: Secondary | ICD-10-CM

## 2024-02-11 DIAGNOSIS — Z13220 Encounter for screening for lipoid disorders: Secondary | ICD-10-CM

## 2024-02-11 DIAGNOSIS — I10 Essential (primary) hypertension: Secondary | ICD-10-CM

## 2024-02-11 MED ORDER — PROPRANOLOL HCL ER 60 MG PO CP24: 60.0000 mg | ORAL_CAPSULE | Freq: Every day | ORAL | 1 refills | Status: DC

## 2024-02-11 NOTE — Assessment & Plan Note (Signed)
 Does not appear to have any significant lingering issues from this.
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
## 2024-02-11 NOTE — Assessment & Plan Note (Signed)
 Pt has a hard time describing the symptoms.  He denies anxiety/panic episodes but I wouldn't rule this but based on how he describes these episodes.  Will get tsh, b12, cmp.  Starting propranolol for bp.

## 2024-02-11 NOTE — Patient Instructions (Addendum)
 It was nice to see you today,  We addressed the following topics today: -When you get the test results we will discuss them with you. - I am sending in a prescription for a medication called propranolol that can help with your blood pressure.  Take this medicine once a day.  Please check your blood pressure at home and record values and then bring them back to us  at your next visit.   Have a great day,  Etha Henle, MD

## 2024-02-11 NOTE — Assessment & Plan Note (Signed)
 Sending in propranolol extended release 60 mg.  May help with his other symptoms as well if they are related to anxiety.  Advised him to check his blood pressure at home and return values to us  at our next visit.

## 2024-02-12 ENCOUNTER — Other Ambulatory Visit: Payer: Self-pay | Admitting: Family Medicine

## 2024-02-12 LAB — CBC WITH DIFFERENTIAL/PLATELET
Basophils Absolute: 0.1 10*3/uL (ref 0.0–0.2)
Basos: 1 %
EOS (ABSOLUTE): 0.2 10*3/uL (ref 0.0–0.4)
Eos: 3 %
Hematocrit: 43.7 % (ref 37.5–51.0)
Hemoglobin: 14.7 g/dL (ref 13.0–17.7)
Immature Grans (Abs): 0 10*3/uL (ref 0.0–0.1)
Immature Granulocytes: 0 %
Lymphocytes Absolute: 2.8 10*3/uL (ref 0.7–3.1)
Lymphs: 41 %
MCH: 30.7 pg (ref 26.6–33.0)
MCHC: 33.6 g/dL (ref 31.5–35.7)
MCV: 91 fL (ref 79–97)
Monocytes Absolute: 0.6 10*3/uL (ref 0.1–0.9)
Monocytes: 9 %
Neutrophils Absolute: 3.2 10*3/uL (ref 1.4–7.0)
Neutrophils: 46 %
Platelets: 396 10*3/uL (ref 150–450)
RBC: 4.79 x10E6/uL (ref 4.14–5.80)
RDW: 13.3 % (ref 11.6–15.4)
WBC: 6.9 10*3/uL (ref 3.4–10.8)

## 2024-02-12 LAB — COMPREHENSIVE METABOLIC PANEL WITH GFR
ALT: 19 IU/L (ref 0–44)
AST: 6 IU/L (ref 0–40)
Albumin: 4.5 g/dL (ref 4.1–5.1)
Alkaline Phosphatase: 99 IU/L (ref 44–121)
BUN/Creatinine Ratio: 16 (ref 9–20)
BUN: 17 mg/dL (ref 6–24)
Bilirubin Total: 0.4 mg/dL (ref 0.0–1.2)
CO2: 20 mmol/L (ref 20–29)
Calcium: 9.4 mg/dL (ref 8.7–10.2)
Chloride: 103 mmol/L (ref 96–106)
Creatinine, Ser: 1.06 mg/dL (ref 0.76–1.27)
Globulin, Total: 2.9 g/dL (ref 1.5–4.5)
Glucose: 106 mg/dL — ABNORMAL HIGH (ref 70–99)
Potassium: 4.7 mmol/L (ref 3.5–5.2)
Sodium: 140 mmol/L (ref 134–144)
Total Protein: 7.4 g/dL (ref 6.0–8.5)
eGFR: 85 mL/min/{1.73_m2} (ref >59)

## 2024-02-12 LAB — LIPID PANEL
Chol/HDL Ratio: 4.1 ratio (ref 0.0–5.0)
Cholesterol, Total: 215 mg/dL — ABNORMAL HIGH (ref 100–199)
HDL: 52 mg/dL (ref >39)
LDL Chol Calc (NIH): 129 mg/dL — ABNORMAL HIGH (ref 0–99)
Triglycerides: 190 mg/dL — ABNORMAL HIGH (ref 0–149)
VLDL Cholesterol Cal: 34 mg/dL (ref 5–40)

## 2024-02-12 LAB — TSH: TSH: 2.63 u[IU]/mL (ref 0.450–4.500)

## 2024-02-12 LAB — PSA: Prostate Specific Ag, Serum: 1.2 ng/mL (ref 0.0–4.0)

## 2024-02-12 LAB — B12 AND FOLATE PANEL
Folate: 9.3 ng/mL (ref >3.0)
Vitamin B-12: 253 pg/mL (ref 232–1245)

## 2024-02-12 MED ORDER — B-12 5000 MCG PO TBDP: 5000.0000 ug | ORAL_TABLET | Freq: Every day | ORAL | 3 refills | Status: AC

## 2024-04-06 ENCOUNTER — Ambulatory Visit: Payer: Self-pay | Admitting: Family Medicine

## 2024-04-21 ENCOUNTER — Other Ambulatory Visit: Payer: Self-pay | Admitting: Family Medicine

## 2024-04-21 MED ORDER — PROPRANOLOL HCL ER 60 MG PO CP24: 60.0000 mg | ORAL_CAPSULE | Freq: Every day | ORAL | 1 refills | Status: DC

## 2024-04-21 NOTE — Telephone Encounter (Signed)
 Copied from CRM [id number]. Topic: Clinical - Medication Refill >> Apr 21, 2024  8:28 AM Shawn Macias wrote: Medication: propranolol  ER (INDERAL  LA) 60 MG 24 hr capsule  Has the patient contacted their pharmacy? Yes (Agent: If no, request that the patient contact the pharmacy for the refill. If patient does not wish to contact the pharmacy document the reason why and proceed with request.) (Agent: If yes, when and what did the pharmacy advise?)  This is the patient's preferred pharmacy:   [facility] [phone] [address], [place] - [address] [place] [address] Phone: [phone] Fax: [phone]   Is this the correct pharmacy for this prescription? Yes If no, delete pharmacy and type the correct one.   Has the prescription been filled recently? Yes  Is the patient out of the medication? No.   Has the patient been seen for an appointment in the last year OR does the patient have an upcoming appointment? Yes  Can we respond through MyChart? Yes  Agent: Please be advised that Rx refills may take up to 3 business days. We ask that you follow-up with your pharmacy.

## 2024-06-02 ENCOUNTER — Ambulatory Visit: Payer: Self-pay | Admitting: Family Medicine

## 2024-06-02 ENCOUNTER — Telehealth: Payer: Self-pay

## 2024-06-02 ENCOUNTER — Encounter: Payer: Self-pay | Admitting: Family Medicine

## 2024-06-02 NOTE — Telephone Encounter (Signed)
 Copied from CRM [id number]. Topic: E2C2 Error Reporting - Scheduling Error >> Jun 02, 2024  9:18 AM CMA Rosina MATSU wrote: This CRM was created to begin the process of an appointment error investigation. Please see the corresponding attachments, forms, and notes for details. >> Jun 02, 2024  3:03 PM Leotis ORN wrote: Thank you for submitting this issue for investigation. After review, we have confirmed that an error was made by an [facility] team member. The matter has been addressed directly with the specialist involved. We appreciate you bringing this to our attention.  Error/Investigation Details: The appointment was not cancelled using the correct reason "No Show/Cancel within 24 hours." Per workflow, this cancellation reason must be selected when patients cancel without 24-hour notice, and notes should be added to document the reason provided. Team-wide reminders have previously been sent regarding proper cancellation documentation.

## 2024-06-28 ENCOUNTER — Other Ambulatory Visit: Payer: Self-pay | Admitting: Family Medicine

## 2024-06-28 MED ORDER — PROPRANOLOL HCL ER 60 MG PO CP24: 60.0000 mg | ORAL_CAPSULE | Freq: Every day | ORAL | 1 refills | Status: DC

## 2024-06-28 NOTE — Telephone Encounter (Signed)
 Copied from CRM #[id number]. Topic: Clinical - Medication Refill >> Jun 28, 2024 10:39 AM Emylou G wrote: Medication: propranolol  ER (INDERAL  LA) 60 MG 24 hr capsule    Has the patient contacted their pharmacy? No (Agent: If no, request that the patient contact the pharmacy for the refill. If patient does not wish to contact the pharmacy document the reason why and proceed with request.) (Agent: If yes, when and what did the pharmacy advise?)  This is the patient's preferred pharmacy:  [facility] [phone] [address] [place] - [address] [place] [address] Phone: [phone] Fax: [phone]  Is this the correct pharmacy for this prescription? Yes If no, delete pharmacy and type the correct one.   Has the prescription been filled recently? Yes  Is the patient out of the medication? Yes - 6 left  Has the patient been seen for an appointment in the last year OR does the patient have an upcoming appointment? Yes  Can we respond through MyChart? No  Agent: Please be advised that Rx refills may take up to 3 business days. We ask that you follow-up with your pharmacy.

## 2024-07-07 ENCOUNTER — Telehealth (HOSPITAL_COMMUNITY): Payer: Self-pay

## 2024-07-07 NOTE — Telephone Encounter (Signed)
 Pt's chart was accessed due to pt being on a backlogged list of patients needing assistance with PCP placement.  Per chart review, pt has since been seen by PCP. No further action taken at this time.

## 2024-07-29 ENCOUNTER — Ambulatory Visit (INDEPENDENT_AMBULATORY_CARE_PROVIDER_SITE_OTHER): Payer: Self-pay | Admitting: Family Medicine

## 2024-07-29 ENCOUNTER — Encounter: Payer: Self-pay | Admitting: Family Medicine

## 2024-07-29 VITALS — BP 130/88 | HR 57 | Ht 70.0 in | Wt 180.0 lb

## 2024-07-29 DIAGNOSIS — E782 Mixed hyperlipidemia: Secondary | ICD-10-CM

## 2024-07-29 DIAGNOSIS — Z8249 Family history of ischemic heart disease and other diseases of the circulatory system: Secondary | ICD-10-CM

## 2024-07-29 DIAGNOSIS — I10 Essential (primary) hypertension: Secondary | ICD-10-CM

## 2024-07-29 DIAGNOSIS — E538 Deficiency of other specified B group vitamins: Secondary | ICD-10-CM

## 2024-07-29 NOTE — Patient Instructions (Signed)
 It was nice to see you today,  We addressed the following topics today:  - I have sent a prescription for Vitamin B12, but you can also purchase it over the counter, which may be cheaper. The dose is 5000 mcg daily. - Please schedule a lab-only visit to have your blood drawn for further cholesterol testing. - I would like you to schedule a follow-up appointment for approximately 4 months from now, around February, which we will use as your annual physical exam. You can schedule this at the front desk. - Try to limit your saturated fat intake to 5 grams or less per day. This means reducing red meat, fatty meats like sausage, and dairy products like cheese and butter. Leaner proteins like chicken and fish are better options.  Have a great day,  Rolan Slain, MD

## 2024-07-29 NOTE — Progress Notes (Signed)
   Established Patient Office Visit  Subjective   Patient ID: Shawn Macias, male    DOB: 31-Oct-1970  Age: 53 y.o. MRN: [id number]  Chief Complaint  Patient presents with   Medical Management of Chronic Issues    HPI  Subjective - Follow-up visit. Reports no new concerns or issues.  Medications Propranolol  for blood pressure, reports feeling it helps prevent blood pressure spikes.  PMH, PSH, FH, Social Hx PMHx: Borderline low Vitamin B12. High cholesterol (LDL 129). FH: Brother with two MIs, first in his 30s. Mother with heart disease starting in her 36s or 46s. Social Hx: Denies smoking. Reports eating a lot of meat. Healthy weight.  ROS Pertinent negatives: denies tingling or numbness.    The 10-year ASCVD risk score (Arnett DK, et al., 2019) is: 4.9%  Health Maintenance Due  Topic Date Due   Hepatitis C Screening  Never done   Hepatitis B Vaccines 19-59 Average Risk (1 of 3 - 19+ 3-dose series) Never done   Pneumococcal Vaccine: 50+ Years (1 of 1 - PCV) Never done   COVID-19 Vaccine (1 - 2025-26 season) Never done      Objective:     BP 130/88   Pulse (!) 57   Ht 5' 10 (1.778 m)   Wt 180 lb (81.6 kg)   SpO2 97%   BMI 25.83 kg/m    Physical Exam Gen: alert, oriented Pulm: no respiratory distress Psych: pleasant affect   No results found for any visits on 07/29/24.      Assessment & Plan:   Moderate mixed hyperlipidemia not requiring statin therapy Assessment & Plan: Recent labs showed elevated cholesterol with an LDL of 129. Total cholesterol was high secondary to the elevated LDL. HDL was normal. Calculated 10-year ASCVD risk is 5%. Given strong family history of premature heart disease (brother with MI in 83s, mother with heart disease in 28s-60s), further risk stratification is warranted. - Plan to check ApoB and Lp(a) levels via blood test. - Discussed the option of a coronary artery calcium score (CT scan) if blood tests are equivocal or if  further risk clarification is desired. Counseled on the $99 out-of-pocket cost. - Counseled on dietary modifications, including limiting saturated fat intake from sources like red meat and dairy to <5g/day. - Continue lifestyle modifications including exercise. - Follow up in 4 months for annual physical exam.  Orders: -     Lipid panel; Future -     Apolipoprotein B; Future -     Lipoprotein A (LPA); Future  Family history of heart attack -     Lipid panel; Future -     Apolipoprotein B; Future -     Lipoprotein A (LPA); Future  B12 deficiency Assessment & Plan: Recent labs showed borderline low B12 level (under 300). - Recommended over-the-counter Vitamin B12 supplement, 5000 mcg daily.   Primary hypertension Assessment & Plan: Bp at goal today.  Continue propranolol       Return in about 4 months (around 11/29/2024) for physical.    Toribio MARLA Slain, MD

## 2024-08-02 DIAGNOSIS — E785 Hyperlipidemia, unspecified: Secondary | ICD-10-CM | POA: Insufficient documentation

## 2024-08-02 DIAGNOSIS — E538 Deficiency of other specified B group vitamins: Secondary | ICD-10-CM | POA: Insufficient documentation

## 2024-08-02 NOTE — Assessment & Plan Note (Signed)
 Bp at goal today.  Continue propranolol 

## 2024-08-02 NOTE — Assessment & Plan Note (Signed)
 Recent labs showed borderline low B12 level (under 300). - Recommended over-the-counter Vitamin B12 supplement, 5000 mcg daily.

## 2024-08-02 NOTE — Assessment & Plan Note (Signed)
 Recent labs showed elevated cholesterol with an LDL of 129. Total cholesterol was high secondary to the elevated LDL. HDL was normal. Calculated 10-year ASCVD risk is 5%. Given strong family history of premature heart disease (brother with MI in 64s, mother with heart disease in 60s-60s), further risk stratification is warranted. - Plan to check ApoB and Lp(a) levels via blood test. - Discussed the option of a coronary artery calcium score (CT scan) if blood tests are equivocal or if further risk clarification is desired. Counseled on the $99 out-of-pocket cost. - Counseled on dietary modifications, including limiting saturated fat intake from sources like red meat and dairy to <5g/day. - Continue lifestyle modifications including exercise. - Follow up in 4 months for annual physical exam.

## 2024-08-03 ENCOUNTER — Ambulatory Visit: Payer: Self-pay

## 2024-08-04 ENCOUNTER — Other Ambulatory Visit: Payer: Self-pay

## 2024-08-05 ENCOUNTER — Other Ambulatory Visit: Payer: Self-pay

## 2024-08-05 DIAGNOSIS — Z8249 Family history of ischemic heart disease and other diseases of the circulatory system: Secondary | ICD-10-CM

## 2024-08-05 DIAGNOSIS — E782 Mixed hyperlipidemia: Secondary | ICD-10-CM

## 2024-08-06 ENCOUNTER — Ambulatory Visit: Payer: Self-pay | Admitting: Family Medicine

## 2024-08-07 LAB — LIPID PANEL
Chol/HDL Ratio: 4.6 ratio (ref 0.0–5.0)
Cholesterol, Total: 216 mg/dL — ABNORMAL HIGH (ref 100–199)
HDL: 47 mg/dL (ref >39)
LDL Chol Calc (NIH): 138 mg/dL — ABNORMAL HIGH (ref 0–99)
Triglycerides: 174 mg/dL — ABNORMAL HIGH (ref 0–149)
VLDL Cholesterol Cal: 31 mg/dL (ref 5–40)

## 2024-08-07 LAB — APOLIPOPROTEIN B: Apolipoprotein B: 122 mg/dL — ABNORMAL HIGH (ref <90)

## 2024-08-07 LAB — LIPOPROTEIN A (LPA): Lipoprotein (a): 316.7 nmol/L — AB (ref <75.0)

## 2024-08-09 NOTE — Telephone Encounter (Signed)
-----   Message from Toribio MARLA Slain sent at 08/09/2024 12:09 PM EST ----- Please let the patient know that his testing showed an elevated LDL cholesterol, but also a high apo B level and a significantly high LPA level.  Both the apo B and the LPA are additional markers of  risk for cardiovascular disease and given his elevation of these and his family history of heart disease I would strongly suggest he start a statin medication to reduce his risk of heart attack or  stroke.    If he is okay with starting a statin I will send in crestor 5mg  for him to take daily and we can recheck his ldl and apo b in 8 weeks.  He will need to schedule a lab visit for that.   ----- Message ----- From: Interface, [facility] Lab Results In Sent: 08/06/2024   7:08 AM EST To: Toribio MARLA Slain, MD

## 2024-08-09 NOTE — Telephone Encounter (Signed)
 LVM for pt to return call to go over result note from provider.

## 2024-08-19 ENCOUNTER — Ambulatory Visit: Payer: Self-pay

## 2024-08-19 ENCOUNTER — Encounter: Payer: Self-pay | Admitting: Family Medicine

## 2024-08-19 ENCOUNTER — Ambulatory Visit (INDEPENDENT_AMBULATORY_CARE_PROVIDER_SITE_OTHER): Payer: Self-pay | Admitting: Family Medicine

## 2024-08-19 VITALS — BP 128/81 | HR 67 | Ht 70.0 in | Wt 174.1 lb

## 2024-08-19 DIAGNOSIS — E7841 Elevated Lipoprotein(a): Secondary | ICD-10-CM

## 2024-08-19 DIAGNOSIS — R519 Headache, unspecified: Secondary | ICD-10-CM

## 2024-08-19 DIAGNOSIS — R202 Paresthesia of skin: Secondary | ICD-10-CM

## 2024-08-19 DIAGNOSIS — F410 Panic disorder [episodic paroxysmal anxiety] without agoraphobia: Secondary | ICD-10-CM

## 2024-08-19 MED ORDER — DIAZEPAM 5 MG PO TABS: 5.0000 mg | ORAL_TABLET | Freq: Every day | ORAL | 0 refills | Status: AC | PRN

## 2024-08-19 MED ORDER — ESCITALOPRAM OXALATE 10 MG PO TABS: 10.0000 mg | ORAL_TABLET | Freq: Every day | ORAL | 2 refills | Status: AC

## 2024-08-19 NOTE — Patient Instructions (Signed)
 It was nice to see you today,  We addressed the following topics today: - I am ordering some additional blood tests and an MRI of your brain to investigate these episodes further. Someone from our office will call you to schedule the MRI. - I am prescribing two new medications. One is Lexapro, which you will take every day at bedtime to help prevent these episodes. It might cause an upset stomach for the first week or two, but this usually goes away. - The second medication is a benzodiazepine, likely lorazepam (Ativan), which you should take only if you feel an episode starting. I will give you five tablets. Taking this during an episode will help us  determine if anxiety is the cause. - Continue taking your propranolol  as you have been. - We will not start any new cholesterol medication today. We can discuss this at your next appointment. - Please schedule a follow-up appointment with me for one month from now. - I will have the nurse draw your blood for the lab tests before you leave today.  Have a great day,  Rolan Slain, MD

## 2024-08-19 NOTE — Assessment & Plan Note (Signed)
 Recent labs show elevated ApoB (122) and a high LPA, indicating a significant independent risk factor for heart disease. LDL is mildly elevated. - Defer starting statin therapy at this time to avoid polypharmacy while initiating new anxiety medications. - Plan to discuss initiating aggressive lipid-lowering therapy at the next follow-up visit in one month.

## 2024-08-19 NOTE — Assessment & Plan Note (Signed)
 Sudden onset of episodes of symptoms of unclear origin with 'tingling' sensation starting in the lower extremities and rapidly progressing upwards, along with feeling of unease, and now most recently with headache and vomiting. The episodes are characterized by a prodrome of a rushing sensation from feet to head, followed by severe dizziness, nausea, and new-onset vomiting and facial flushing. Episodes are not associated with LOC. Differential includes seizure activity vs. panic attacks. Would favor panic episode based on history. Nothing on exam concerning for acute focal deficit. his acute elevation in blood pressure noted after the episode is likely a consequence of the episodes rather than the cause. - checking tsh and metanephrines - Order MRI of the brain to rule out structural abnormality or increased intracranial pressure. - Refer to Neurology for evaluation, including consideration for an EEG. - Start Lexapro daily to treat for possible panic disorder. - Prescribe diazepam  5mg  (10 tabs) for PRN use during acute episodes to aid in diagnosis and provide relief. - Follow up in 1 month to review test results and response to treatment.

## 2024-08-19 NOTE — Telephone Encounter (Signed)
 FYI Only or Action Required?: FYI only for provider: appointment scheduled on 08/19/2024.  Patient was last seen in primary care on 07/29/2024 by Chandra Toribio POUR, MD.  Called Nurse Triage reporting Hypertension.  Symptoms began yesterday.  Interventions attempted: Prescription medications: BP meds.  Symptoms are: gradually worsening.  Triage Disposition: See Physician Within 24 Hours  Patient/caregiver understands and will follow disposition?: Yes     Copied from CRM [id number]. Topic: Clinical - Red Word Triage >> Aug 19, 2024  8:33 AM Amber H wrote: [facility] that prompted transfer to Nurse Triage: Charmaine (patients girlfriend) called and stated patient had an episode of being hot/sweaty, face was red, complained of headaches, blood pressure 178/113 last night, this morning 144/101, fell to the floor last night. Reason for Disposition  Systolic BP >= 180 OR Diastolic >= 110  Answer Assessment - Initial Assessment Questions 1. BLOOD PRESSURE: What is your blood pressure? Did you take at least two measurements 5 minutes apart?     178/113 last night , 144/101 this morning 2. ONSET: When did you take your blood pressure?     Yesterday  3. HOW: How did you take your blood pressure? (e.g., automatic home BP monitor, visiting nurse)     Automatic home BP monitor  4. HISTORY: Do you have a history of high blood pressure?     Yes  5. MEDICINES: Are you taking any medicines for blood pressure? Have you missed any doses recently?     Yes, denies missing any doses recently  6. OTHER SYMPTOMS: Do you have any symptoms? (e.g., blurred vision, chest pain, difficulty breathing, headache, weakness)   Currently only a slight headache   Pt states yesterday around 1300 fell to the floor due to symptoms. He states he got hot/ sweaty and had an episode of vomiting . Currently denies chest pain, dizziness or SOB.  Protocols used: Blood Pressure - High-A-AH

## 2024-08-19 NOTE — Progress Notes (Signed)
 Acute Office Visit  Subjective:     Patient ID: Shawn Macias, male    DOB: 1971-05-12, 53 y.o.   MRN: [id number]  Chief Complaint  Patient presents with   Medical Management of Chronic Issues    HPI Patient is in today for   Subjective - Reports another episode yesterday, described as the worst one yet. Sensation starts in feet and rushes up to the head over seconds, followed by severe dizziness, requiring them to get on the floor. Does not lose consciousness but is unable to function and feels like they need to sleep. The episode lasts 1-2 hours, with a lingering feeling of not being normal. - Yesterday's episode was associated with new-onset vomiting and facial flushing. Reports a slight headache and dizziness persisting since the event. - Describes a post-episode hangover feeling, which is unusual as the feeling typically resolves by the next day. - Has had these episodes for over a year, with a several-month remission period prior to this recent event. - Expresses significant worry and fear of dying during episodes, feeling like something's going to bust in my body. Reports feeling hot in the head, clammy, and hot and cold at the same time during the visit, which they attribute to anxiety about the symptoms. - Notes intermittent chest discomfort for a few seconds at a time last week, not associated with exertion. - Reports a history of long trips and is concerned about blood clots. - Reports a history of visiting the ER for these symptoms in the past, where tests were unrevealing.  Medications Propranolol , , Lexapro (newly prescribed), diazepam  (newly prescribed).  PMH, PSH, FH, Social Hx PMHx: High cholesterol (elevated LDL, ApoB 122, and high LPA), anxiety (history of Xanax  use), hypertension (BP 170s/100s after recent episode). No history of seizures. Social Hx: Denies being a pill guy, dislikes taking medication including Tylenol  or Advil  unless necessary. Has  small children.  ROS Pertinent Positives: Dizziness, headache, nausea, vomiting, facial flushing, sensation of internal rushing from feet to head, feeling hot in the head, clamminess, intermittent chest discomfort. Pertinent Negatives: Denies loss of consciousness, twitching/shaking, hearing loss, vision changes (blurry/double vision).   ROS      Objective:    BP 128/81   Pulse 67   Ht 5' 10 (1.778 m)   Wt 174 lb 1.9 oz (79 kg)   SpO2 97%   BMI 24.98 kg/m    Physical Exam Gen: alert, oriented Pulm: no respiratory distress Psych: anxious appearing.   No results found for any visits on 08/19/24.      Assessment & Plan:   Nonintractable episodic headache, unspecified headache type -     Metanephrines, plasma -     MR BRAIN WO CONTRAST; Future  Panic -     Metanephrines, plasma -     TSH + free T4  Elevated lipoprotein(a) Assessment & Plan: Recent labs show elevated ApoB (122) and a high LPA, indicating a significant independent risk factor for heart disease. LDL is mildly elevated. - Defer starting statin therapy at this time to avoid polypharmacy while initiating new anxiety medications. - Plan to discuss initiating aggressive lipid-lowering therapy at the next follow-up visit in one month.   Tingling sensation Assessment & Plan: Sudden onset of episodes of symptoms of unclear origin with 'tingling' sensation starting in the lower extremities and rapidly progressing upwards, along with feeling of unease, and now most recently with headache and vomiting. The episodes are characterized by a prodrome of a rushing  sensation from feet to head, followed by severe dizziness, nausea, and new-onset vomiting and facial flushing. Episodes are not associated with LOC. Differential includes seizure activity vs. panic attacks. Would favor panic episode based on history. Nothing on exam concerning for acute focal deficit. his acute elevation in blood pressure noted after the episode  is likely a consequence of the episodes rather than the cause. - checking tsh and metanephrines - Order MRI of the brain to rule out structural abnormality or increased intracranial pressure. - Refer to Neurology for evaluation, including consideration for an EEG. - Start Lexapro daily to treat for possible panic disorder. - Prescribe diazepam  5mg  (10 tabs) for PRN use during acute episodes to aid in diagnosis and provide relief. - Follow up in 1 month to review test results and response to treatment.   Other orders -     Escitalopram Oxalate; Take 1 tablet (10 mg total) by mouth daily.  Dispense: 30 tablet; Refill: 2 -     diazePAM ; Take 1 tablet (5 mg total) by mouth daily as needed for anxiety.  Dispense: 10 tablet; Refill: 0     Return in about 4 weeks (around 09/16/2024) for medication f/u.  Toribio MARLA Slain, MD

## 2024-08-24 ENCOUNTER — Telehealth: Payer: Self-pay | Admitting: Family Medicine

## 2024-08-24 NOTE — Telephone Encounter (Signed)
 Called pt to find out what location he would be willing to go to for his MRI. Since he does not have insurance I was going to ask if he would be willing to go to [facility] located at [address] [place], [place] [address]. If so I will have referral sent over asap.

## 2024-08-26 LAB — TSH+FREE T4
Free T4: 1.42 ng/dL (ref 0.82–1.77)
TSH: 2.2 u[IU]/mL (ref 0.450–4.500)

## 2024-08-26 LAB — METANEPHRINES, PLASMA
Metanephrine, Free: 39.4 pg/mL (ref 0.0–88.0)
Normetanephrine, Free: 149.4 pg/mL (ref 0.0–244.0)

## 2024-08-30 ENCOUNTER — Ambulatory Visit: Payer: Self-pay | Admitting: Family Medicine

## 2024-08-30 ENCOUNTER — Telehealth: Payer: Self-pay

## 2024-08-30 NOTE — Progress Notes (Signed)
 Called pt LVM to contact the office if pt calls please advised

## 2024-08-30 NOTE — Telephone Encounter (Signed)
 Copied from CRM #[id number]. Topic: Clinical - Lab/Test Results >> Aug 30, 2024  4:52 PM Tobias CROME wrote: Reason for CRM: Relayed results to patient. Patient states he is scheduled for MRI for this Saturday, 09/04/24 at 7:30am.   No further questions.

## 2024-09-01 ENCOUNTER — Other Ambulatory Visit: Payer: Self-pay | Admitting: Family Medicine

## 2024-09-04 ENCOUNTER — Ambulatory Visit
Admission: RE | Admit: 2024-09-04 | Discharge: 2024-09-04 | Disposition: A | Discharge status: Home / Self Care | Payer: Self-pay | Source: Ambulatory Visit | Attending: Family Medicine | Admitting: Family Medicine

## 2024-09-04 DIAGNOSIS — R519 Headache, unspecified: Secondary | ICD-10-CM

## 2024-09-10 NOTE — Progress Notes (Signed)
 Called patient LVM to contact the office if pt calls please advise

## 2024-09-13 NOTE — Progress Notes (Signed)
 Called pt he is advised of his labs results

## 2024-09-16 ENCOUNTER — Encounter: Payer: Self-pay | Admitting: Family Medicine

## 2024-09-16 ENCOUNTER — Ambulatory Visit (INDEPENDENT_AMBULATORY_CARE_PROVIDER_SITE_OTHER): Payer: Self-pay | Admitting: Family Medicine

## 2024-09-16 VITALS — BP 129/87 | HR 62 | Ht 70.0 in | Wt 175.4 lb

## 2024-09-16 DIAGNOSIS — Z8249 Family history of ischemic heart disease and other diseases of the circulatory system: Secondary | ICD-10-CM | POA: Insufficient documentation

## 2024-09-16 DIAGNOSIS — R202 Paresthesia of skin: Secondary | ICD-10-CM

## 2024-09-16 DIAGNOSIS — I10 Essential (primary) hypertension: Secondary | ICD-10-CM

## 2024-09-16 DIAGNOSIS — E7841 Elevated Lipoprotein(a): Secondary | ICD-10-CM

## 2024-09-16 DIAGNOSIS — R079 Chest pain, unspecified: Secondary | ICD-10-CM | POA: Insufficient documentation

## 2024-09-16 MED ORDER — ROSUVASTATIN CALCIUM 5 MG PO TABS: 5.0000 mg | ORAL_TABLET | Freq: Every day | ORAL | 3 refills | Status: AC

## 2024-09-16 NOTE — Assessment & Plan Note (Signed)
-   Reports history of attacks but has been symptom-free for several months. Discontinued Lexapro  due to side effects. Uses Valium  5 mg sparingly. (4x so far) - Continue current management with as-needed Valium . - will d/c lexapro

## 2024-09-16 NOTE — Assessment & Plan Note (Signed)
-   History of hypertension, currently on propranolol  ER. BP elevated in office today, though patient missed morning dose. Repeat was < 130 systolic - Recheck blood pressure in office. Will monitor home BP readings if needed to guide further management.

## 2024-09-16 NOTE — Assessment & Plan Note (Signed)
-   Dyslipidemia characterized by elevated LDL, ApoB, and a significantly elevated genetic marker (LPA). Strong family history of MIs. - Start Crestor (rosuvastatin) at a low dose.

## 2024-09-16 NOTE — Assessment & Plan Note (Signed)
-   History of intermittent, non-exertional, sharp/twisting chest pain ongoing for years. Family history significant for premature coronary artery disease in brother (30s) and mother. Labs notable for hyperlipidemia with elevated LDL, ApoB, and LPA. EKG from 10/2023 was normal. The pain character is atypical for angina, but given risk factors, ischemic cause should be evaluated. - Refer to Cardiology for further evaluation,

## 2024-09-16 NOTE — Patient Instructions (Signed)
 It was nice to see you today,  We addressed the following topics today:  - I am starting you on a cholesterol medication called Crestor. The main side effect to watch for is muscle aches or cramping. If you experience this, please let us  know. We can adjust the dose if needed, but I am starting you on the lowest dose, which is usually well tolerated. - I am also putting in a referral for you to see a heart specialist (cardiologist) to evaluate your chest pain. They will decide what tests are necessary.  Have a great day,  Rolan Slain, MD
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
## 2024-10-29 ENCOUNTER — Other Ambulatory Visit: Payer: Self-pay | Admitting: Family Medicine

## 2024-11-22 ENCOUNTER — Ambulatory Visit: Payer: Self-pay

## 2024-11-29 ENCOUNTER — Encounter: Payer: Self-pay | Admitting: Family Medicine

## 2024-12-08 ENCOUNTER — Other Ambulatory Visit: Payer: Self-pay

## 2024-12-15 ENCOUNTER — Ambulatory Visit: Payer: Self-pay | Admitting: Family Medicine
